# Patient Record
Sex: Male | Born: 1950 | Race: White | Hispanic: No | Marital: Married | Smoking: Former smoker
Health system: Southern US, Community
[De-identification: ages and names within clinical notes are randomized; demographics above are authoritative.]

## PROBLEM LIST (undated history)

## (undated) DIAGNOSIS — F1220 Cannabis dependence, uncomplicated: Secondary | ICD-10-CM

## (undated) DIAGNOSIS — F32A Depression, unspecified: Secondary | ICD-10-CM

## (undated) DIAGNOSIS — I10 Essential (primary) hypertension: Secondary | ICD-10-CM

## (undated) DIAGNOSIS — F329 Major depressive disorder, single episode, unspecified: Secondary | ICD-10-CM

## (undated) HISTORY — DX: Major depressive disorder, single episode, unspecified: F32.9

## (undated) HISTORY — PX: NO PAST SURGERIES: SHX2092

## (undated) HISTORY — DX: Depression, unspecified: F32.A

## (undated) HISTORY — DX: Cannabis dependence, uncomplicated: F12.20

## (undated) HISTORY — DX: Essential (primary) hypertension: I10

---

## 2014-02-10 ENCOUNTER — Ambulatory Visit (INDEPENDENT_AMBULATORY_CARE_PROVIDER_SITE_OTHER): Payer: BC Managed Care – PPO | Admitting: Psychiatry

## 2014-02-10 ENCOUNTER — Encounter (INDEPENDENT_AMBULATORY_CARE_PROVIDER_SITE_OTHER): Payer: Self-pay

## 2014-02-10 VITALS — BP 176/82 | HR 70 | Ht 67.5 in | Wt 174.8 lb

## 2014-02-10 DIAGNOSIS — F1210 Cannabis abuse, uncomplicated: Secondary | ICD-10-CM

## 2014-02-10 DIAGNOSIS — F332 Major depressive disorder, recurrent severe without psychotic features: Secondary | ICD-10-CM

## 2014-02-10 DIAGNOSIS — F339 Major depressive disorder, recurrent, unspecified: Secondary | ICD-10-CM

## 2014-02-10 MED ORDER — VENLAFAXINE HCL ER 75 MG PO CP24: 225.0000 mg | ORAL_CAPSULE | Freq: Every day | ORAL | Status: DC

## 2014-02-10 NOTE — Progress Notes (Signed)
Psychiatric Assessment Adult  Patient Identification:  Jose Perez Date of Evaluation:  02/10/2014 Chief Complaint: History of Depression, Anxiety, Cannabis Abuse  History of Chief Complaint:  No chief complaint on file.   HPI Patient is a 63 year old male, who is new to [facility], but known to me from prior treatment . 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 He has a history of Major Depression, Social Anxiety, and Cannabis Dependence in the past. For the latter, he was in contract with [facility] for a period of several years, which included random drug testing and which he completed successfully. 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  Patient has been grieving her death, and of concern he also relapsed on cannabis, which he has smoked on several occasions  Since sister's death- last smoked 10-15 days ago. He relapsed after close to 9 years of sobriety, while visiting dying sister in [place] with his brother, who " offered me some pot,I just took it". Of note, he denies any cannabis use during  Work and denies any working under the influence.  Patient denies any serious depression or any severe neuro-vegetative symptoms of depression at this time. He does feel he is sad, due to death of sister. He acknowledges he has been having some marital discord and tension related to his cannabis use- and  Wife ( who has a history of alcohol dependence but has been sober for many years) has " given me a hard time about it" He also recently started smoking cigarettes lately ( 1 PPD now) after several years of not smoking. He takes Effexor XR for depression, he has been on this medication for 10 years and it has been very effective and well tolerated .  Review of  Systems No headache, no visual disturbances, no chest pain, no shortness of breath, no abdominal pain, no melenas, no GU symptoms, no peripheral edema, no neurologic symptoms, no rash Physical Exam  Depressive Symptoms: depressed mood, loss of energy/fatigue, denies anhedonia, denies any SI  (Hypo) Manic Symptoms:   Elevated Mood:  No Irritable Mood:  No Grandiosity:  No Distractibility:  No Labiality of Mood:  No Delusions:  No Hallucinations:  No Impulsivity:  No Sexually Inappropriate Behavior:  No Financial Extravagance:  No Flight of Ideas:  No  Anxiety Symptoms: Excessive Worry:  No Panic Symptoms:  No Agoraphobia:  No Obsessive Compulsive: No  Symptoms: no Specific Phobias:  No Social Anxiety:  Yes  Psychotic Symptoms:  Hallucinations: No None Delusions:  No Paranoia:  No   Ideas of Reference:  No  PTSD Symptoms: Ever had a traumatic exposure:  No Had a traumatic exposure in the last month:  No Re-experiencing: No None Hypervigilance:  No Hyperarousal: No None Avoidance: No None  Traumatic Brain Injury: No none   Past Psychiatric History: Diagnosis: He has been diagnosed with MDD, Social Anxiety, and Cannabis Dependence.  Hospitalizations: One prior psychiatric admission 9-10 years ago, due to severe  depression.  A that time received ECT   Outpatient Care: establishing care here at [facility]   Substance Abuse Care: Had been going to [facility] in the past   Self-Mutilation: No   Suicidal Attempts:No   Violent Behaviors:  No    Past Medical History:  HTN  History of Loss of Consciousness:  No Seizure History:  No Cardiac History:  No Allergies: NKDA Current Medications: Effexor XR 225 mgrs QDAY. He is also on an antihypertensive medication ( lisinopril?) No current outpatient prescriptions on file.   No current facility-administered medications for this visit.    Previous Psychotropic Medications:  Medication Dose    Effexor XR   225 mgrs a day- has been on this medication x 9 years with good response                      Substance Abuse History in the last 12 months: Substance Age of 1st Use Last Use Amount Specific Type  Nicotine  young adult      Alcohol  social drinker in the past      Cannabis  college age   one week ago     Opiates  no history      Cocaine  no history     Methamphetamines  no history     LSD  no history     Ecstasy  no history      Benzodiazepines  no history     Caffeine       Inhalants  no history      Others:                          Medical Consequences of Substance Abuse: none   Legal Consequences of Substance Abuse: none   Family Consequences of Substance Abuse: none   Blackouts:  No DT's:  No Withdrawal Symptoms:  No None  Social History: Current [place], [place] Place of Birth: [place] Family Members:  Sister recently passed away from Esophageal Cancer- this seems to have been a major trigger for decompensation Marital Status:  Married Children: 3 adult children, two in college   Sons:   Daughters:  Relationships:  Education: Baristahysician  Educational Problems/Performance:  Religious Beliefs/Practices: Catholic  History of Abuse: No history of abuse  Teacher, musicccupational Experiences; Military History:  None. Legal History:Denies  Hobbies/Interests:   Family History: Father  ,  One Sister had history of Schizoaffective Disorder , Anxiety Mental Status Examination/Evaluation: Objective:  Appearance: Well Groomed  Eye Contact::  Good  Speech:  Normal Rate  Volume:  Normal  Mood:  Mildly depressed but full range of affect   Affect:  constricted and full in range  Thought Process:  Goal Directed and Linear  Orientation:  Full (Time, Place, and Person)  Thought Content:  no hallucinations,no delusions  Suicidal Thoughts:  No- denies any suicidal ideations or any thoughts of hurting himself in any way or anyone else   Homicidal  Thoughts:  No  Judgement:  Good  Insight:  Good  Psychomotor Activity:  Normal  Akathisia:  No  Handed:  Right  AIMS (if indicated):    Assets:  Communication Skills Desire for Improvement Financial Resources/Insurance Housing Resilience Social Support Talents/Skills    Laboratory/X-Ray Psychological Evaluation(s)        Assessment:   Patient is a 63 year old man known to me from prior outpatient treatment. [facility] to establish Outpatient medication management. He is married, a practicing physician He has a history of Major Depression, with a severe episode 10 years ago, a history of Cannabis Abuse, and had been  Sober x 9 years up to recently.  He also has a history of Social Anxiety. Effexor XR, on which he has been for many years, has been a very effective and well tolerated medication for him. He had been doing well but after his sister's death a few months ago from cancer, he has been having some grief , but no  Symptoms of MDD. He did , of concern, relapse on cannabis while visiting his dying sister, and has consumed cannabis a few times since then, last time About 10-15 days ago. The licensure constraints Carolyn Stare/fears that helped him consolidate sobriety years ago are less effective now, as he states he is in the process  Of retiring from active practice anyway, and no longer has any financial concerns. He does acknowledge his cannabis relapse has caused some marital discord. Of note BP was elevated upon admission, retaken as he left , had improved to 150/87  AXIS I Major Depression, Recurrent, no psychotic features. Cannabis Abuse   AXIS II Deferred  AXIS III No past medical history on file.   AXIS IV death of sister 4 months ago, marital tension  AXIS V 60-65   Treatment Plan/Recommendations:  Plan of Care: Continue outpatient treatment, for medication management. Have encouraged patient to stop smoking, stop using cannabis and start going to AA or  NA again   Laboratory:  none at this time   Psychotherapy:patient may benefit from individual psychotherapy and I have recommended he consider this option  Medications:  Continue Effexor XR 225 mgrs QDAY. Have renewed it x 1 month  Routine PRN Medications:  No  Consultations: No   Safety Concerns: No concerns about imminent risk at this time based on this visit. Patient to go to ED if any SI emerges .  Other:  Have encouraged patient to go to see his PCP soon to see if he needs adjustment of his antihypertensive management. Patient is aware that Effexor XR could contribute to elevated BP, but as he has done so well with this medication we agreed to continue .     Nehemiah MassedOBOS, [name], MD 11/3/20151:16 PM

## 2014-02-24 ENCOUNTER — Encounter (HOSPITAL_COMMUNITY): Payer: Self-pay | Admitting: Psychiatry

## 2014-03-10 ENCOUNTER — Ambulatory Visit (INDEPENDENT_AMBULATORY_CARE_PROVIDER_SITE_OTHER): Payer: Medicare Other | Admitting: Psychiatry

## 2014-03-10 VITALS — BP 162/90 | HR 85 | Ht 67.0 in | Wt 174.6 lb

## 2014-03-10 DIAGNOSIS — F1220 Cannabis dependence, uncomplicated: Secondary | ICD-10-CM

## 2014-03-10 DIAGNOSIS — F4010 Social phobia, unspecified: Secondary | ICD-10-CM

## 2014-03-10 DIAGNOSIS — F321 Major depressive disorder, single episode, moderate: Secondary | ICD-10-CM

## 2014-03-10 DIAGNOSIS — F329 Major depressive disorder, single episode, unspecified: Secondary | ICD-10-CM

## 2014-03-10 NOTE — Progress Notes (Signed)
[facility] [id number] Progress Note  Lynnette Caffeyhomas A Sedlacek [id number] 63 y.o.  03/10/2014 1:19 PM  Chief Complaint: Patient comes in with his wife requesting family meeting- see below  History of Present Illness:I saw patient today for a period of 50 minutes,  along with his wife, for a  FAMILY MEETING.   As noted, patient is a 63 year old man, with a history of MDD , Cannabis Dependence, and Social Anxiety.  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  He had been sober for close to 10 years, but relapsed earlier this year after his sister died from esophageal cancer and his brother offered him cannabis while they were visiting her prior to her death.  Since then he admits he has used on several occasions, last time on day prior to Thanksgiving.   His wife- they have been married 30 years -, who is known to me from family meetings in the past, has a history of alcohol dependence, and has been sober x 9 years. She is very active in [place] and has a sponsor.  Regarding patient's psychiatric management he has been stable on Effexor XR 225 mgrs QDAY , which he tolerates well and which he has been on for years.  At this time he denies any significant depression. Sleep, appetite, energy level , reported as normal. No anhedonia, no SI.   Wife does state, however, that since he relapsed he has been more prone to neglecting chores around the house, more prone to procrastinate. He denies.  MSE- at this time patient is fully alert, attentive, oriented x 3, no evidence of intoxication, mood is "OK", denies depression, affect appropriate, subtly irritable at times, no thought disorder, no SI , no HI, no hallucinations, no delusions, future oriented. Looking forward to retirement in 4-5 months, and thinking of where to relocate.  They wanted to discuss how his relapse has negatively affected family dynamics. Patient 's wife angry that he relapsed, and concerned it will  herald another depressive episode in the future. She states he should become active in 12 steps , get a sponsor , and work on his recovery.  He admits he is ambivalent about stopping cannabis completely at this time. He states he is smoking only occasionally. He feels cannabis improves his mentation and he feels more creative when on cannabis. He feels more at ease as well. She points out that the opposite is true- he tends to be more passive and yet more irritable when using.  We discussed the above issues at length, I validated how substance abuse can negatively impact trust and contribute to marital tension. Patient does state he plans to maintain sobriety for now, and states he has no cannabis left, but admits he is thinking of using next time he travels to see his brother for Christmas.  ASSESSMENT- patient not depressed, but continuing to smoke cannabis , although not daily. States he last smoked day prior to thanksgiving. His relapse has caused marital tension. 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 venlafaxine XR (EFFEXOR XR) 75 MG 24 hr capsule Take 3 capsules (225 mg total) by mouth daily with breakfast.    Past Psychiatric History/Hospitalization(s): Anxiety: Yes Bipolar Disorder: No Depression: Yes Mania: No Psychosis: No Schizophrenia:  No Personality Disorder: No Hospitalization for psychiatric illness: Yes- 10 years ago- for depression.  History of Electroconvulsive Shock Therapy: Yes- 10 years ago for above  Prior Suicide Attempts: No  Physical Exam: Constitutional:  There were no vitals taken for this visit.  General Appearance: alert, oriented, no acute distress  Musculoskeletal: Strength & Muscle Tone: within normal limits Gait & Station: normal Patient leans: N/A  Psychiatric: Speech (describe rate, volume, coherence, spontaneity, and abnormalities if any): normal  Thought Process (describe rate, content, abstract reasoning, and computation): normal   Associations: Intact  Thoughts: normal  Mental Status: Orientation: 0x3  Mood & Affect: normal affect- subtly irritable Attention Span & Concentration: good   Medical Decision Making (Choose Three): Established Problem, Stable/Improving (1), Review of Psycho-Social Stressors (1) and Review of Last Therapy Session (1)  Assessment: Axis I: MDD, Social Phobia, Cannabis Dependence   Axis II: deferred   Axis III: HTN  Axis IV: marital strain- see above. Recent death of sister   Axis V:  2970  PLAN-  I agreed to see him for medication management and support in two to three weeks, prior to Christmas. Will refer for couples counseling if patient and wife interested. Encouraged patient to abstain and engage in 12 step participation. Continue Effexor XR 225 mgrs Daily- does not need script. Continue to follow up with his PCP for BP management. Patient agrees to contact me sooner if any worsening prior to next appt   Nehemiah MassedOBOS, Yasheka Fossett, MD 03/10/2014

## 2014-03-10 NOTE — Progress Notes (Signed)
I saw patient today for a period of 50 minutes,  along with his wife, for a  FAMILY MEETING.   As noted, patient is a [age] year old man, with a history of MDD , Cannabis Dependence, and Social Anxiety.  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  He had been sober for close to 10 years, but relapsed earlier this year after his sister died from esophageal cancer and his brother offered him cannabis while they were visiting her prior to her death.  Since then he admits he has used on several occasions, last time on day prior to Thanksgiving.   His wife- they have been married 30 years -, who is known to me from family meetings in the past, has a history of alcohol dependence, and has been sober x 9 years. She is very active in [employer] and has a sponsor.  Regarding patient's psychiatric management he has been stable on Effexor XR 225 mgrs QDAY , which he tolerates well and which he has been on for years.  At this time he denies any significant depression. Sleep, appetite, energy level , reported as normal. No anhedonia, no SI.   Wife does state, however, that since he relapsed he has been more prone to neglecting chores around the house, more prone to procrastinate. He denies.  MSE- at this time patient is fully alert, attentive, oriented x 3, no evidence of intoxication, mood is "OK", denies depression, affect appropriate, subtly irritable at times, no thought disorder, no SI , no HI, no hallucinations, no delusions, future oriented. Looking forward to retirement in 4-5 months, and thinking of where to relocate.  They wanted to discuss how his relapse has negatively affected family dynamics. Patient 's wife angry that he relapsed, and concerned it will herald another depressive episode in the future. She states he should become active in 12 steps , get a sponsor , and work on his recovery.  He admits he is ambivalent about stopping cannabis completely at this time. He states  he is smoking only occasionally. He feels cannabis improves his mentation and he feels more creative when on cannabis. He feels more at ease as well. She points out that the opposite is true- he tends to be more passive and yet more irritable when using.  We discussed the above issues at length, I validated how substance abuse can negatively impact trust and contribute to marital tension. Patient does state he plans to maintain sobriety for now, and states he has no cannabis left, but admits he is thinking of using next time he travels to see his brother for Christmas.  ASSESSMENT- patient not depressed, but continuing to smoke cannabis , although not daily. States he last smoked day prior to thanksgiving. His relapse has caused marital tension. Tolerating Effexor XR well  PLAN-  I agreed to see him for medication management and support in two to three weeks, prior to Christmas. Will refer for couples counseling if patient and wife interested. Encouraged patient to abstain and engage in 12 step participation. Continue Effexor XR 225 mgrs Daily- does not need script. Continue to follow up with his PCP for BP management. Patient agrees to contact me sooner if any worsening prior to next appt

## 2014-03-13 ENCOUNTER — Encounter (HOSPITAL_COMMUNITY): Payer: Self-pay | Admitting: Psychiatry

## 2014-03-13 DIAGNOSIS — F1220 Cannabis dependence, uncomplicated: Secondary | ICD-10-CM

## 2014-03-13 DIAGNOSIS — F329 Major depressive disorder, single episode, unspecified: Secondary | ICD-10-CM | POA: Insufficient documentation

## 2014-03-13 HISTORY — DX: Cannabis dependence, uncomplicated: F12.20

## 2014-03-24 ENCOUNTER — Ambulatory Visit (HOSPITAL_COMMUNITY): Payer: BC Managed Care – PPO | Admitting: Psychiatry

## 2014-04-14 ENCOUNTER — Encounter (HOSPITAL_COMMUNITY): Payer: Self-pay | Admitting: Psychiatry

## 2014-04-14 ENCOUNTER — Ambulatory Visit (INDEPENDENT_AMBULATORY_CARE_PROVIDER_SITE_OTHER): Payer: Medicare Other | Admitting: Psychiatry

## 2014-04-14 VITALS — BP 144/78 | HR 74 | Ht 68.0 in | Wt 172.2 lb

## 2014-04-14 DIAGNOSIS — F1220 Cannabis dependence, uncomplicated: Secondary | ICD-10-CM

## 2014-04-14 DIAGNOSIS — F332 Major depressive disorder, recurrent severe without psychotic features: Secondary | ICD-10-CM

## 2014-04-14 DIAGNOSIS — F4010 Social phobia, unspecified: Secondary | ICD-10-CM

## 2014-04-14 DIAGNOSIS — F329 Major depressive disorder, single episode, unspecified: Secondary | ICD-10-CM

## 2014-04-14 MED ORDER — BUSPIRONE HCL 10 MG PO TABS: 10.0000 mg | ORAL_TABLET | Freq: Two times a day (BID) | ORAL | Status: DC

## 2014-04-14 NOTE — Progress Notes (Signed)
[facility] [id number] Progress Note  Jose Caffeyhomas A Perez [id number] 64 y.o.  04/14/2014 2:43 PM  Chief Complaint: Depression, history of Cannabis Dependence   Subjective : Patient states he is improved compared to last visit. He states he has not used cannabis  Regularly since last Thanksgiving, except for one isolated episode in December. He has been experiencing some increased free floating anxiety, but states that overall he has been able to function well in his everyday activities, to include work.  Objective : At this time patient is improved- he presents with an improved mood, and fuller range of affect. His wife came with him and she corroborates that he " is more like his usual self" and that their relationship has dramatically improved over recent weeks, which she attributes to his abstinence from cannabis. He acknowledges improvement and that " the pot changes me" . As noted, he continues to function well in daily activities, to include working as a Development worker, communityphysician. He does state he has been experiencing some increased anxiety compared to prior, and describes both some social phobia and a sense of free floating ill defined anxiety. 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 States " there were so many people in the house, it made me anxious". He is not feeling particularly depressed and states that Effexor XR, which he has been taking for many years, continues to be well tolerated and effective. Of note, patient had restarted smoking cigarettes when he relapsed on cannabis and after his sister died in May 2015. He has now stopped since 12/30. Had an isolated episode of dizziness/pre-syncope 3 weeks ago, unsure of etiology, but thinks it may have been related to dehydration and not eating well that day. Is following up with his  PCP regarding this.    History of Present Illness: Suicidal Ideation: No Plan Formed: No Patient has means to carry out plan: No  Homicidal Ideation: No Plan Formed: No Patient has means to carry out plan: No  Review of Systems: Psychiatric: Agitation: No Hallucination: No Depressed Mood: No Insomnia: No Hypersomnia: No Altered Concentration: No Feels Worthless: No Grandiose Ideas: No Belief In Special Powers: No New/Increased Substance Abuse: as noted, now sober x 3-4 weeks Compulsions: No  Neurologic: Headache: No Seizure: No Paresthesias: No  Past Medical Family, Social History:  No changes, has history of HTN- well controlled on antihypertensive medication.   Outpatient Encounter Prescriptions as of 04/14/2014  Medication Sig  . busPIRone (BUSPAR) 10 MG tablet Take 1 tablet (10 mg total) by mouth 2 (two) times daily.  Marland Kitchen. venlafaxine XR (EFFEXOR XR) 75 MG 24 hr capsule Take 3 capsules (225 mg total) by mouth daily with breakfast.    Past Psychiatric History/Hospitalization(s): Anxiety: Yes Bipolar Disorder: No Depression: Yes Mania: No Psychosis: No Schizophrenia: No  Personality Disorder: No Hospitalization for psychiatric illness: Yes- 10 years ago- for depression- at that time was treated with ECT . History of Electroconvulsive Shock Therapy: Yes- as above.  Prior Suicide Attempts: No  Physical Exam: Constitutional:  BP 144/78 mmHg  Pulse 74  Ht 5\' 8"  (1.727 m)  Wt 172 lb 3.2 oz (78.109 kg)  BMI 26.19 kg/m2  General Appearance: alert, oriented, no acute distress and well nourished  Musculoskeletal: Strength & Muscle Tone: within normal limits Gait & Station: normal Patient leans: N/A  Psychiatric: Speech (describe  rate, volume, coherence, spontaneity, and abnormalities if any): Normal  Thought Process (describe rate, content, abstract reasoning, and computation): Linear , well organized   Associations: Intact  Thoughts: normal and no suicidal ,  no homicidal , no psychotic symptoms  Mental Status: Orientation: 0x3  Mood & Affect: normal affect Attention Span & Concentration: normal  Medical Decision Making (Choose Three): Established Problem, Stable/Improving (1), Review of Psycho-Social Stressors (1) and Review of New Medication or Change in Dosage (2)  Assessment: Axis I: MDD by History, Social Phobia, Cannabis Dependence  Axis II: none   Axis III: HTN by history  Axis IV:  Some marital strain- now improving, death of sister from esophageal cancer in May 2015 ( they were very close), planning on retiring later this year  Axis V: 70   Plan:  Continue Effexor XR 225 mgrs QDAY Start Buspar 10 mgrs BID for anxiety- side effects reviewed Encourage/promote ongoing  abstinence from  Cannabis and nicotine Will see in 4-5 weeks Agrees to contact me sooner if any worsening prior  Nehemiah Massed, MD 04/14/2014

## 2014-05-19 ENCOUNTER — Encounter (HOSPITAL_COMMUNITY): Payer: Self-pay | Admitting: Psychiatry

## 2014-05-19 ENCOUNTER — Ambulatory Visit (INDEPENDENT_AMBULATORY_CARE_PROVIDER_SITE_OTHER): Payer: Medicare Other | Admitting: Psychiatry

## 2014-05-19 VITALS — BP 126/72 | HR 72 | Ht 67.0 in | Wt 176.6 lb

## 2014-05-19 DIAGNOSIS — F329 Major depressive disorder, single episode, unspecified: Secondary | ICD-10-CM

## 2014-05-19 DIAGNOSIS — F4010 Social phobia, unspecified: Secondary | ICD-10-CM

## 2014-05-19 DIAGNOSIS — F332 Major depressive disorder, recurrent severe without psychotic features: Secondary | ICD-10-CM

## 2014-05-19 DIAGNOSIS — I10 Essential (primary) hypertension: Secondary | ICD-10-CM

## 2014-05-19 DIAGNOSIS — F1221 Cannabis dependence, in remission: Secondary | ICD-10-CM

## 2014-05-19 HISTORY — DX: Essential (primary) hypertension: I10

## 2014-05-19 MED ORDER — VENLAFAXINE HCL ER 75 MG PO CP24: 225.0000 mg | ORAL_CAPSULE | Freq: Every day | ORAL | Status: DC

## 2014-05-19 MED ORDER — BUSPIRONE HCL 10 MG PO TABS: 10.0000 mg | ORAL_TABLET | Freq: Two times a day (BID) | ORAL | Status: DC

## 2014-05-19 NOTE — Progress Notes (Signed)
[facility] [id number] Progress Note  Jose Perez [id number] 64 y.o.  05/19/2014 1:49 PM  Time spent with patient- 35 minutes   Subjective-  At this time patient is doing well. He states he is not feeling depressed, and is not endorsing any neuro-vegetative symptoms of depression. He states he is functioning well in daily activities, to include working full time. Relationship with wife, which had become strained after his relapse, has normalized. He is now sober x almost three months. He denies any cravings. He denies any medication side effects. He states the Buspar, which as added recently, has been well tolerated and has helped decrease symptoms of anxiety.  Objective -  Patient is currently doing well. As noted above, he is now sober and is doing well in his daily activities.  In the context of sobriety he states " I feel more like myself" and states that " the personality changes I  had when I was using have gone away" ( with drug use tended to be somewhat more irritable). He is currently tolerating medications well and denies side effects. Sleep, appetite, energy level all within normal limits. No Anhedonia. Responds well to support, encouragement , review of progress. I have encouraged him to go to AA or NA , but he admits he is not interested in this treatment modality at this time.    History of Present Illness: Suicidal Ideation: No Plan Formed: No Patient has means to carry out plan: No  Homicidal Ideation: No Plan Formed: No Patient has means to carry out plan: No  Review of Systems: Psychiatric: Agitation: No Hallucination: No Depressed Mood: No Insomnia: No Hypersomnia: No Altered Concentration: No Feels Worthless: No Grandiose Ideas: No Belief In Special Powers: No New/Increased Substance Abuse: No Compulsions: No  Neurologic: Headache: No Seizure: No Paresthesias: No  Past Medical Family, Social History:  At this time functioning well ,  reports improved relationship with wife, has spent   More time maintaining communication  with adult children. 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   Outpatient Encounter Prescriptions as of 05/19/2014  Medication Sig  . benazepril (LOTENSIN) 10 MG tablet Take 10 mg by mouth daily.  . busPIRone (BUSPAR) 10 MG tablet Take 1 tablet (10 mg total) by mouth 2 (two) times daily.  Marland Kitchen. venlafaxine XR (EFFEXOR XR) 75 MG 24 hr capsule Take 3 capsules (225 mg total) by mouth daily with breakfast.    Past Psychiatric History/Hospitalization(s): Anxiety: Yes Bipolar Disorder: No Depression: Yes Mania: No Psychosis: No Schizophrenia: No Personality Disorder: No Hospitalization for psychiatric illness: Yes History of Electroconvulsive Shock Therapy: Yes Prior Suicide Attempts: No  Physical Exam: Constitutional:  BP 126/72 mmHg  Pulse 72  Ht 5\' 7"  (1.702 m)  Wt 176 lb 9.6 oz (80.105 kg)  BMI 27.65 kg/m2  General Appearance: alert, oriented, no acute distress and well nourished  Musculoskeletal: Strength & Muscle Tone: within normal limits Gait & Station: normal Patient leans: N/A  Psychiatric: Speech (describe rate, volume, coherence, spontaneity, and abnormalities if any): normal    Thought Process (describe rate, content, abstract reasoning, and computation):linear , well organized   Associations: Coherent and Intact  Thoughts: normal and no hallucinations, no delusions, no suicidal ideations , no homicidal ideations  Mental Status: Orientation: oriented x 3  Mood & Affect: normal affect and mood euthymic at this time Attention Span & Concentration: within normal limits   Medical Decision Making (Choose Three): Established Problem, Stable/Improving (1), Review of Last  Therapy Session (1) and Review of Medication Regimen & Side Effects (2)  Assessment: Currently patient is improved and at this time euthymic. He is sober x almost three months, and in the context of  sobriety is functioning better in everyday activities and relationships. He is tolerating medications well.   Diagnosis Major Depression , without psychotic symptoms Cannabis Dependence in early remission Social Phobia by history   Plan: Return in 5 -6 weeks. Agrees to contact me sooner if any worsening prior Renew Effexor XR 225 mgrs QAM Renew Buspar 10 mgrs BID ( x 1 month with one refill)  Side effects have been reviewed.   Nehemiah Massed, MD 05/19/2014

## 2014-06-23 ENCOUNTER — Encounter (HOSPITAL_COMMUNITY): Payer: Self-pay | Admitting: Psychiatry

## 2014-06-23 ENCOUNTER — Ambulatory Visit (INDEPENDENT_AMBULATORY_CARE_PROVIDER_SITE_OTHER): Payer: Medicare Other | Admitting: Psychiatry

## 2014-06-23 VITALS — BP 162/98 | HR 90 | Ht 67.0 in | Wt 175.0 lb

## 2014-06-23 DIAGNOSIS — F329 Major depressive disorder, single episode, unspecified: Secondary | ICD-10-CM

## 2014-06-23 DIAGNOSIS — F1221 Cannabis dependence, in remission: Secondary | ICD-10-CM

## 2014-06-23 DIAGNOSIS — F4010 Social phobia, unspecified: Secondary | ICD-10-CM

## 2014-06-23 DIAGNOSIS — F332 Major depressive disorder, recurrent severe without psychotic features: Secondary | ICD-10-CM

## 2014-06-23 MED ORDER — VENLAFAXINE HCL ER 75 MG PO CP24: 225.0000 mg | ORAL_CAPSULE | Freq: Every day | ORAL | Status: DC

## 2014-06-23 NOTE — Progress Notes (Signed)
[facility] [id number] Progress Note  Jose Caffeyhomas A Perez [id number] 64 y.o.  06/23/2014 2:13 PM  Chief Complaint:  Patient states that he is doing better .  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 He states that he has not used any cannabis since November of 2015. At this time he is tolerating medications well and denies side effects.  Objective : Presents with improvement and is currently stable. Denies any significant depression or neuro-vegetative symptom of MDD . Social Anxiety symptoms , which had exacerbated after last year's stressors, has tapered/ abated. As noted, at this time he reports sobriety since 11/15, and denies current cravings for cannabis, which was his substance of choice. States he is doing well at work and reports stable family relationships / functioning. He is somewhat hypertensive today, but states that he often checks his BP at home and it is " usually in the 120/80 range ". He suspectes that he has some degree of " White lab coat syndrome" so that BP is artificially elevated on these visits. He states he has an upcoming trip to [place] to visit his elderly mother. This could be an issue, since brother who offered him cannabis and facilitated relapse lives with mother. Patient is aware of this concern, and states he has arranged trip so that he will be with his wife and adult daughter at all times. He denies cravings and states he feels strong in his recovery at this time. He is tolerating Effexor XR and Buspar ( new medication for him ) well. He responds well to support, empathy, and review of his coping skills .   History of Present Illness: Suicidal Ideation: No Plan Formed: No Patient has means to carry out plan: No  Homicidal Ideation: No Plan Formed: No Patient has means to carry out plan:  No  Review of Systems: Psychiatric: Agitation: No Hallucination: No Depressed Mood: No Insomnia: No Hypersomnia: No Altered Concentration: No Feels Worthless: No Grandiose Ideas: No Belief In Special Powers: No New/Increased Substance Abuse: No Compulsions: No  Neurologic: Headache: No Seizure: No Paresthesias: No  Past Medical Family, Social History: married x many years, three adult children, all successful, youngest in Social workerlaw school, middle child graduated [employer] and is now in her first job, older son in [place] working full time,  Patient  Works full time as MD.   Outpatient Encounter Prescriptions as of 06/23/2014  Medication Sig  . benazepril (LOTENSIN) 10 MG tablet Take 10 mg by mouth daily.  . busPIRone (BUSPAR) 10 MG tablet Take 1 tablet (10 mg total) by mouth 2 (two) times daily.  Marland Kitchen. venlafaxine XR (EFFEXOR XR) 75 MG 24 hr capsule Take 3 capsules (225 mg total) by mouth daily with breakfast.  . [DISCONTINUED] venlafaxine XR (EFFEXOR XR) 75 MG 24 hr capsule Take 3 capsules (225 mg total) by mouth daily with breakfast.    Past Psychiatric History/Hospitalization(s): 10 years ago for severe depression at the time Anxiety: Yes- has history of social anxiety, much improved with treatment  Bipolar Disorder: No Depression: Yes as above ( currently not depressed , however)  Mania: No Psychosis: No Schizophrenia: No Personality Disorder: No Hospitalization for psychiatric illness: Yes- as above, 10 years ago, at which time he did get ECT  History of Electroconvulsive Shock Therapy: Yes Prior Suicide Attempts: No  Physical Exam: Constitutional:  BP 162/98 mmHg  Pulse 90  Ht  (1.702 m)  Wt 175 lb (79.379 kg)  BMI 27.40 kg/m2  General Appearance: alert, oriented, no acute distress and well nourished  Musculoskeletal: Strength & Muscle Tone: within normal limits Gait & Station: normal Patient leans: N/A  Psychiatric: Speech (describe rate, volume,  coherence, spontaneity, and abnormalities if any): normal tone, volume and rate   Thought Process (describe rate, content, abstract reasoning, and computation):  Linear and well organized   Associations: Coherent and Relevant  Thoughts: normal and no SI, no HI, no hallucinations,  no dellusions  Mental Status: Orientation: fully alert, attentive, oriented  Mood & Affect: normal affect and mood is euthymic at the present time Attention Span & Concentration:  Within normal   Medical Decision Making (Choose Three): Established Problem, Stable/Improving (1), Review of Psycho-Social Stressors (1) and Review of Medication Regimen & Side Effects (2)  Assessment:  Briefly, 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 He had a very severe episode of depression 10 years ago, which required inpatient admission and ECT. At that time he was also using cannabis regularly. After that  ( and with involvement in [facility] for the first several years) he remained stable and euthymic for 9+ years. Last year, following the deaths of his elderly father and of his sister, to whom he was very close , he decompensated/relapsed on cannabis , and developed worsening anxiety symptoms. He stopped using cannabis as of November 2015, and since then has tended to improved. At this time he is asymptomatic and functioning att baseline. Tolerating medications well .  Axis I:  MDD, Social Phobia, Cannabis Dependence, currently in early remission  Axis II: none   Axis III: none   Axis IV:  Death of sister and father   Axis V: 75   Plan:  Continue Effexor XR 225 mgrs a day Continue Buspar 10 mgrs BID Will see in four weeks.  Patient agrees to contact me sooner if any worsening prior.   Nehemiah Massed, MD 06/23/2014

## 2014-07-09 ENCOUNTER — Telehealth (HOSPITAL_COMMUNITY): Payer: Self-pay | Admitting: Psychiatry

## 2014-07-09 NOTE — Telephone Encounter (Signed)
Patient called me recently , stating that [place] was in [place] visiting family, and requesting call back . I have made several attempts to call back at his cell pone and today at his home phone. No answer, so have left message asking him to call me back.

## 2014-07-21 ENCOUNTER — Ambulatory Visit (HOSPITAL_COMMUNITY): Payer: Medicare Other | Admitting: Psychiatry

## 2014-08-04 ENCOUNTER — Encounter (HOSPITAL_COMMUNITY): Payer: Self-pay | Admitting: Psychiatry

## 2014-08-04 ENCOUNTER — Ambulatory Visit (INDEPENDENT_AMBULATORY_CARE_PROVIDER_SITE_OTHER): Payer: Medicare Other | Admitting: Psychiatry

## 2014-08-04 VITALS — BP 156/88 | HR 84 | Ht 67.0 in | Wt 174.2 lb

## 2014-08-04 DIAGNOSIS — F1220 Cannabis dependence, uncomplicated: Secondary | ICD-10-CM

## 2014-08-04 DIAGNOSIS — F1210 Cannabis abuse, uncomplicated: Secondary | ICD-10-CM

## 2014-08-04 DIAGNOSIS — F4010 Social phobia, unspecified: Secondary | ICD-10-CM

## 2014-08-04 DIAGNOSIS — F3342 Major depressive disorder, recurrent, in full remission: Secondary | ICD-10-CM

## 2014-08-04 NOTE — Progress Notes (Signed)
[facility] Medication Management Note - Family session  Duration 55 minutes   Jose Perez [id number] 64 y.o.  08/04/2014 6:37 PM  Subjective -  Jose Perez  Reports recent relapse on cannabis , relating to recent trip to [place] to visit his elderly mother. His brother resides with mother and smokes cannabis, and Jose Perez relapsed soon after his arrival there. He also smoked cannabis recently while on a trip to [place] recently. States last used cannabis last week.  Objective : As noted, Jose Perez had recent cannabis relapse. Of note, he requested to have a conversation with wife via phone as well , and we had a teleconference interaction with Jose Perez and her . 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 Wife states that this is a dangerous trip and that he should have thought it out more carefully and considered her input. Of note, Jose Perez's wife is alcoholic , now sober x 10 + years and very involved in 12 step meetings. She states she has encouraged Jose Perez to attend NA or AA but has not been interested.  His drug use has caused increased marital tension recently. Jose Perez acknowledges he changes with cannabis, but points out some of the changes are positive - less introverted, more likely to do challenging things, less anxious.  We discussed how his cannabis use has also been related to his sister's death and more recently his mother being diagnosed with cancer. Jose Perez acknowledges that cannabis likely keeps him from feeling the grief more acutely. At this time denies  depression, and is not reporting any neuro-vegetative symptoms of depression.  Also, is not presenting or reporting symptoms of mania, and recent behaviors as above do not seem correlated with any mania.  He remains on Buspar and Effexor XR and is tolerating these medications well- denies side effects.   History of Present Illness: Suicidal Ideation: No Plan Formed: No Jose Perez has means to carry out plan: No  Homicidal Ideation: No Plan Formed: No Jose Perez has means to carry out plan: No  Review of Systems: Psychiatric: Agitation: No Hallucination: No Depressed Mood: No Insomnia: No Hypersomnia: No Altered Concentration: No Feels Worthless: No Grandiose Ideas: No Belief In Special Powers: No New/Increased Substance Abuse: Yes Compulsions: No  Neurologic: Headache: No Seizure: No Paresthesias: No   Outpatient Encounter Prescriptions as of 08/04/2014  Medication Sig  . benazepril (LOTENSIN) 10 MG tablet Take 10 mg by mouth daily.  . busPIRone (BUSPAR) 10 MG tablet Take 1 tablet (10 mg total) by mouth 2 (two) times daily.  Marland Kitchen. venlafaxine XR (EFFEXOR XR) 75 MG 24 hr capsule Take 3 capsules (225 mg total) by mouth daily with breakfast.    Past Psychiatric History/Hospitalization(s): Anxiety: Yes- Jose Perez states his social anxiety tends to improve with cannabis use  Bipolar Disorder: No Depression: Yes Mania: No Psychosis: No Schizophrenia: No Personality Disorder: No Hospitalization for psychiatric illness: Yes- admission for depression - received ECT - 10 years ago History of Electroconvulsive Shock Therapy: Yes- see above  Prior Suicide Attempts: No  Physical Exam: Constitutional:  BP 156/88 mmHg  Pulse 84  Ht  (1.702 m)  Wt 174 lb 3.2 oz (79.017 kg)  BMI 27.28 kg/m2  General Appearance: alert, oriented, no acute distress  Musculoskeletal: Strength & Muscle Tone: within normal limits Gait & Station: normal Jose Perez leans: N/A  Psychiatric: Speech  (describe rate, volume, coherence, spontaneity, and abnormalities if any):  Normal, of note, however, Jose Perez's presentation tends to subtly different after he relapses-  Clothing choices change, and he becomes vaguely, subtly irritable.  Thought Process (describe rate, content, abstract reasoning, and computation):  Normal, goal directed  Associations: Coherent and Intact  Thoughts: normal and  no hallucinations, no delusions, no SI or HI  Mental Status: Orientation: fully oriented  Mood & Affect: normal affect and denies depression at present. Subtly irritable ( this is a subtle change compared to his previous presentation and not very apparent )  Attention Span & Concentration:  Normal   Medical Decision Making (Choose Three): Review of Psycho-Social Stressors (1), Established Problem, Worsening (2) and Review of Medication Regimen & Side Effects (2)  Assessment: Jose Perez had relapsed on cannabis last year after his sister died , after being sober for 10 years or so. He recently relapsed again during a trip to [place] to visit his elderly mother.  His brother lives there and uses cannabis regularly and Jose Perez states he relapsed when his brother offered him cannabis. He then used again during a recent trip to [place]. Last used last week. Cannabis use tends to cause vague changes in personality, increased risk taking , and increased marital tension, particularly as wife is in recovery herself and very active in [place] .  He recently gambled during his trip to [place] and also recently agreed to go on a Mission trip to [place] that his now deceased sister had been involved in. Jose Perez states he wants to honor his sister's legacy by doing this.  Jose Perez not presenting with any mania or hypomania. Sleep good, no pressured speech, no restlessness,no grandiosity. He is denying depression and not endorsing neuro-vegetative symptoms of depression.  Axis I: MDD  By history, Social Anxiety/ Phobia by  History, Cannabis Dependence  II Deferred, III HTN. IV death of sister, mother medically ill, marital discord V [id number]   Plan:  I have encouraged full abstinence from cannabis .   Jose Perez to continue Effexor XR 225 mgrs QDAY and Buspar 10 mgrs BID- paper script given as this computer not linked to script printer. Will see in one month- agrees to contact me sooner if any worsening prior.  Nehemiah Massed, MD 08/04/2014

## 2014-09-15 ENCOUNTER — Ambulatory Visit (HOSPITAL_COMMUNITY): Payer: Self-pay | Admitting: Psychiatry

## 2014-09-22 ENCOUNTER — Encounter (HOSPITAL_COMMUNITY): Payer: Self-pay | Admitting: Psychiatry

## 2014-09-22 ENCOUNTER — Ambulatory Visit (INDEPENDENT_AMBULATORY_CARE_PROVIDER_SITE_OTHER): Payer: Medicare Other | Admitting: Psychiatry

## 2014-09-22 DIAGNOSIS — F1210 Cannabis abuse, uncomplicated: Secondary | ICD-10-CM

## 2014-09-22 DIAGNOSIS — F329 Major depressive disorder, single episode, unspecified: Secondary | ICD-10-CM | POA: Diagnosis not present

## 2014-09-22 DIAGNOSIS — F332 Major depressive disorder, recurrent severe without psychotic features: Secondary | ICD-10-CM

## 2014-09-22 MED ORDER — BUSPIRONE HCL 10 MG PO TABS: 10.0000 mg | ORAL_TABLET | Freq: Two times a day (BID) | ORAL | Status: DC

## 2014-09-22 NOTE — Progress Notes (Signed)
[facility] MD Progress Note  09/22/2014 6:54 PM Jose Perez  MRN:  [id number] Subjective:   Patient states he has been "OK". Denies depression at this time. 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 He states it was a very interesting experience , and he states he was appalled by the poverty and the difficulties the people there face.  He states , however, it was a wonderful experience and is hoping to be able to return.  Admits to episode of cannabis use when he returned , via [place], at which time he visited with his brother, who smokes cannabis regularly. Last used cannabis 12 days ago. Denies depression or sadness at this time- describes mood as stable at this time. 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 Objective : Patient came to session with his wife .  As noted, he currently minimizes symptoms of depression. Anxiety symptoms also reported as currently well controlled. He is tolerating medications well- Effexor XR and Buspar. We reviewed cannabis use at length- patient has a history of cannabis dependence, and has been using cannabis episodically over recent months, primarily when he visits his family in [place], and at least once when he travelled to a medical conference. Wife states that she knows when he uses because he tends to become more distant emotionally and vaguely more irritable. Patient has had long periods of sobriety in the past- up to 6 years - and has been in 12 step program in the past. He states " this time I really am tired of using, I want to stop". Encouraged to resume 12 step program participation. Wife also wants to travel with him to [place]- he is scheduled to visit family in late August- to help patient avoid cannabis use and to confront brother. Wife  states " he knows he has a problem with marihuana, as a brother he should not be offering it to him or using it in front of him".  Principal Problem: Depression- currently well controlled on medication. Cannabis Abuse . Diagnosis:   Patient Active Problem List   Diagnosis Date Noted  . Major depression [F32.2] 03/13/2014  . Cannabis dependence [F12.20] 03/13/2014   Total Time spent with patient: 30 minutes   Past Medical History:  Past Medical History  Diagnosis Date  . Cannabis dependence 03/13/2014  . Depression   . Hypertension 05/19/14   No past surgical history on file. Family History:  Family History  Problem Relation Age of Onset  . Depression Sister    Social History:  History  Alcohol Use No     History  Drug Use No    History   Social History  . Marital Status: Unknown    Spouse Name: N/A  . Number of Children: N/A  . Years of Education: N/A   Social History Main Topics  . Smoking status: Former Smoker    Quit date: 05/19/2009  . Smokeless tobacco: Never Used  . Alcohol Use: No  . Drug Use: No  . Sexual Activity: Yes   Other Topics Concern  . Not on file   Social History Narrative   Additional History:    Sleep: Good  Appetite:  Good   Assessment:   Musculoskeletal: Strength & Muscle Tone: within normal  limits Gait & Station: normal Patient leans: N/A   Psychiatric Specialty Exam: Physical Exam  ROS- no nausea, no vomiting endorsed .  There were no vitals taken for this visit.There is no weight on file to calculate BMI.  General Appearance: Well Groomed  Patent attorney::  Good  Speech:  Normal Rate  Volume:  Normal  Mood:  Euthymic  Affect:  Appropriate  Thought Process:  Linear  Orientation:  Full (Time, Place, and Person)  Thought Content:  no hallucinations, no delusions, not internally preoccupied   Suicidal Thoughts:  No  Homicidal Thoughts:  No  Memory:  recent and remote grossly intact   Judgement:  Good  Insight:   Present  Psychomotor Activity:  Normal  Concentration:  Good  Recall:  Good  Fund of Knowledge:Good  Language: Good  Akathisia:  Negative  Handed:  Right  AIMS (if indicated):     Assets:  Communication Skills Desire for Improvement Financial Resources/Insurance Housing Resilience  ADL's:  Intact  Cognition: WNL  Sleep:        Current Medications: Current Outpatient Prescriptions  Medication Sig Dispense Refill  . benazepril (LOTENSIN) 10 MG tablet Take 10 mg by mouth daily.    . busPIRone (BUSPAR) 10 MG tablet Take 1 tablet (10 mg total) by mouth 2 (two) times daily. 60 tablet 1  . venlafaxine XR (EFFEXOR XR) 75 MG 24 hr capsule Take 3 capsules (225 mg total) by mouth daily with breakfast. 90 capsule 1   No current facility-administered medications for this visit.    Lab Results: No results found for this or any previous visit (from the past 48 hour(s)).  Physical Findings: AIMS:  , ,  ,  ,    CIWA:    COWS:      Assessment- patient currently stable insofar as depression and anxiety and has tolerated Effexor XR well ( has been on this medication for years). He is now also on Buspar, and feels it has helped decrease anxiety. He has a history of Cannabis Dependence and over recent months has had several instances of using cannabis, particularly during trips away from home, and when seeing his brother in [place], who smokes cannabis regularly. Patient acknowledges he is tired of using and expresses motivation in sobriety. His use has caused increased tension with wife . States last used 2 weeks ago.   Treatment Plan Summary: Continue outpatient treatment- will see in 4-6 weeks - agrees to see me sooner if any worsening prior. Encouraged to abstain from cannabis and to go to 12 step meetings/ avoid people, places and situations he associates with drug use in order to decrease risk of relapse Continue Effexor XR 225 mgrs QDAY- does not currently need script and Buspar 10 mgrs  BID- script renewed via e script.    Medical Decision Making:  Established Problem, Stable/Improving (1), Review of Psycho-Social Stressors (1), Review or order clinical lab tests (1) and Review of Medication Regimen & Side Effects (2)     COBOS, [name] 09/22/2014, 6:54 PM

## 2014-10-27 ENCOUNTER — Ambulatory Visit (HOSPITAL_COMMUNITY): Payer: Self-pay | Admitting: Psychiatry

## 2014-11-03 ENCOUNTER — Ambulatory Visit (HOSPITAL_COMMUNITY): Payer: Self-pay | Admitting: Psychiatry

## 2014-11-10 ENCOUNTER — Ambulatory Visit (HOSPITAL_COMMUNITY): Payer: Self-pay | Admitting: Psychiatry

## 2014-11-17 ENCOUNTER — Ambulatory Visit (INDEPENDENT_AMBULATORY_CARE_PROVIDER_SITE_OTHER): Payer: Medicare Other | Admitting: Psychiatry

## 2014-11-17 VITALS — BP 154/92 | HR 80 | Ht 68.0 in | Wt 174.6 lb

## 2014-11-17 DIAGNOSIS — F3340 Major depressive disorder, recurrent, in remission, unspecified: Secondary | ICD-10-CM

## 2014-11-17 DIAGNOSIS — F1221 Cannabis dependence, in remission: Secondary | ICD-10-CM | POA: Diagnosis not present

## 2014-11-17 DIAGNOSIS — F331 Major depressive disorder, recurrent, moderate: Secondary | ICD-10-CM

## 2014-11-17 MED ORDER — VENLAFAXINE HCL ER 75 MG PO CP24: 225.0000 mg | ORAL_CAPSULE | Freq: Every day | ORAL | Status: DC

## 2014-11-18 ENCOUNTER — Encounter (HOSPITAL_COMMUNITY): Payer: Self-pay | Admitting: Psychiatry

## 2014-11-18 NOTE — Progress Notes (Signed)
[facility] MD Progress Note  11/18/2014 5:37 PM Jose Perez  MRN:  [id number] Subjective:   At this time patient is doing well. He is functioning well in daily activities , to include full time work as Development worker, community. Family life has normalized and become less tense as patient has remained sober/abstinent. Denies medication side effects. Principal Problem:  Major Depression, currently in remission. Cannabis Dependence, currently in early remission. Objective : Patient came in with his wife , who provided collateral information. At this time patient is doing well, and wife corroborates that she does not think he is using any cannabis recently. We spoke about cannabis abuse at length- patient states he last used  About 2 months ago, during his last trip to [place]. He states he enjoys the effects of cannabis because he feels less introverted, and feels more assertive. However, his wife and family experience his use differently- wife states he becomes " irritable and obnoxious and someone different from the man I love and married ".  Patient states he is gaining insight into this , and at this time denies any desire to use or any cravings . He does have a trip to [place] scheduled for 1 month from now, as he plans to see his elderly mother who lives there. States he has spoken with his brother ( who provides him with cannabis when he goes there- only uses during these trips out of his home )and told him  He is no longer wanting to use and brother has agreed not to offer or use in front of him. Wife states she would like to go with him to insure sobriety, but that " I can' be there with him all the time like a mother - he is an adult" Have encouraged patient to restart 12 step program participation, which he admits was very helpful in the past . Insofar as depression and anxiety, he is currently stable and euthymic, functioning well in daily activities- no neuro vegetative symptoms of depression at present  . Diagnosis:   Patient Active Problem List   Diagnosis Date Noted  . Major depression [F32.2] 03/13/2014  . Cannabis dependence [F12.20] 03/13/2014   Total Time spent with patient: 30 minutes   Past Medical History:  Past Medical History  Diagnosis Date  . Cannabis dependence 03/13/2014  . Depression   . Hypertension 05/19/14   No past surgical history on file. Family History:  Family History  Problem Relation Age of Onset  . Depression Sister    Social History:  History  Alcohol Use No     History  Drug Use No    Social History   Social History  . Marital Status: Unknown    Spouse Name: N/A  . Number of Children: N/A  . Years of Education: N/A   Social History Main Topics  . Smoking status: Former Smoker    Quit date: 05/19/2009  . Smokeless tobacco: Never Used  . Alcohol Use: No  . Drug Use: No  . Sexual Activity: Yes   Other Topics Concern  . Not on file   Social History Narrative   Additional History:    Sleep: Good  Appetite:  Good   Assessment:   Musculoskeletal: Strength & Muscle Tone: within normal limits Gait & Station: normal Patient leans: N/A   Psychiatric Specialty Exam: Physical Exam  ROS- no headache, no nausea, no vomiting   Blood pressure 154/92, pulse 80, height  (1.727 m), weight 174 lb 9.6 oz (79.198  kg).Body mass index is 26.55 kg/(m^2).  General Appearance: Well Groomed  Patent attorney::  Good  Speech:  Normal Rate  Volume:  Normal  Mood:  Euthymic  Affect:  Appropriate  Thought Process:  Linear  Orientation:  Full (Time, Place, and Person)  Thought Content:  no hallucinations, no delusions   Suicidal Thoughts:  No  Homicidal Thoughts:  No  Memory:  recent and remote grossly intact   Judgement:  Good  Insight:  Present  Psychomotor Activity:  EPS  Concentration:  Good  Recall:  Good  Fund of Knowledge:Good  Language: Good  Akathisia:  Negative  Handed:  Right  AIMS (if indicated):     Assets:   Communication Skills Desire for Improvement Housing Resilience Social Support Vocational/Educational  ADL's:  Intact  Cognition: WNL  Sleep:        Current Medications: Current Outpatient Prescriptions  Medication Sig Dispense Refill  . benazepril (LOTENSIN) 10 MG tablet Take 10 mg by mouth daily.    . busPIRone (BUSPAR) 10 MG tablet Take 1 tablet (10 mg total) by mouth 2 (two) times daily. 60 tablet 1  . venlafaxine XR (EFFEXOR XR) 75 MG 24 hr capsule Take 3 capsules (225 mg total) by mouth daily with breakfast. 90 capsule 1   No current facility-administered medications for this visit.    Lab Results: No results found for this or any previous visit (from the past 48 hour(s)).  Physical Findings: AIMS:  , ,  ,  ,    CIWA:    COWS:      Assessment- patient currently stable and euthymic. Now sober from cannabis x about 2 months, and motivated in maintaining sobriety, but not wanting to cancel trip to visit family in [place] in 1 month ( he has historically relapsed only during trips away from home and has obtained cannabis from brother in [place] during previous trips) . Patient states he has spoken with brother about not wanting to use cannabis any more. Tolerating medications well.  Treatment Plan Summary: Plan will see in 2-3 months, sooner if any worsening prior and continue Effexor XR and Buspar at current doses, as above    Medical Decision Making:  Established Problem, Stable/Improving (1), Review of Psycho-Social Stressors (1), Review or order clinical lab tests (1) and Review of Medication Regimen & Side Effects (2)     Jose Perez 11/18/2014, 5:37 PM

## 2014-12-29 ENCOUNTER — Ambulatory Visit (INDEPENDENT_AMBULATORY_CARE_PROVIDER_SITE_OTHER): Payer: Medicare Other | Admitting: Psychiatry

## 2014-12-29 VITALS — BP 135/97 | HR 100 | Ht 67.0 in | Wt 165.0 lb

## 2014-12-29 DIAGNOSIS — F1220 Cannabis dependence, uncomplicated: Secondary | ICD-10-CM

## 2014-12-29 DIAGNOSIS — F324 Major depressive disorder, single episode, in partial remission: Secondary | ICD-10-CM | POA: Diagnosis not present

## 2014-12-29 DIAGNOSIS — F332 Major depressive disorder, recurrent severe without psychotic features: Secondary | ICD-10-CM

## 2014-12-29 DIAGNOSIS — Z87891 Personal history of nicotine dependence: Secondary | ICD-10-CM | POA: Diagnosis not present

## 2014-12-29 MED ORDER — BUSPIRONE HCL 10 MG PO TABS: 10.0000 mg | ORAL_TABLET | Freq: Two times a day (BID) | ORAL | Status: DC

## 2014-12-29 MED ORDER — VENLAFAXINE HCL ER 75 MG PO CP24: 225.0000 mg | ORAL_CAPSULE | Freq: Every day | ORAL | Status: DC

## 2015-01-01 ENCOUNTER — Encounter (HOSPITAL_COMMUNITY): Payer: Self-pay | Admitting: Psychiatry

## 2015-01-01 NOTE — Progress Notes (Signed)
[facility] MD Progress Note  01/01/2015 7:33 PM Jose Perez  MRN:  [id number] Subjective:   Regarding depression patient states he has been doing "OK", and denies current depression or significant neuro-vegetative symptoms. Regarding Cannabis Abuse, he did relapse on his last trip to [place]. He had been sober for several weeks to months and had intention of staying sober, and reports he avoided his brother, as his brother smokes cannabis and has procured drug for him in the past . However, he had cravings , relapsed. Denies any use since trip/ return to this area . He states that his wife , who has a history of alcohol dependence , but who has been sober x 11 years and is very active in [place], is " really angry at me ", due to his ongoing use of cannabis, and his attempts to lie and cover his use to her . One issue is that patient states he is convinced that marihuana makes him feel better, be more extroverted, more fun loving, less timid. He does have a history of social anxiety . His wife, however , reports that when he smokes cannabis he becomes more irritable, less empathic, more prone to lie , and that he is not that timid or shy when he does not use drugs and that this is essentially an excuse he uses to rationalize his use . He denies medication side effects. ( Buspar and Effexor )  States he is functioning well in daily activities, working full time, planning on retirement in a year or two. Objective : We discussed issues as above - reviewed, using non confrontational strategies, the negative impact that cannabis has had for him, to include previous difficulties , suspension from Board, strain  With his wife , who has at times threatened divorce over his using cannabis, and concern that his adult children notice that something is " off " in his attitude when he uses, even though they do not know he is using . I encouraged him to consider full abstinence , and to go to NA or AA regularly, which helped  him in the past- we reviewed potential rehab , but he states he definitely does not feel he needs this level of treatment . He admits his motivation to stop cannabis at this time is partial, and related to external stressor of possible end of his marriage .  Of note, patient denies working under the influence and states only uses when travelling out of town - Insofar as depression, he is not depressed, and presents euthymic, tolerating medications well.  Principal Problem:  Major Depression , stable, in remission, Cannabis Abuse  Diagnosis:   Patient Active Problem List   Diagnosis Date Noted  . Major depression [F32.2] 03/13/2014  . Cannabis dependence [F12.20] 03/13/2014   Total Time spent with patient: 30 minutes   Past Medical History:  Past Medical History  Diagnosis Date  . Cannabis dependence 03/13/2014  . Depression   . Hypertension 05/19/14   No past surgical history on file. Family History:  Family History  Problem Relation Age of Onset  . Depression Sister    Social History:  History  Alcohol Use No     History  Drug Use No    Social History   Social History  . Marital Status: Unknown    Spouse Name: N/A  . Number of Children: N/A  . Years of Education: N/A   Social History Main Topics  . Smoking status: Former Smoker  Quit date: 05/19/2009  . Smokeless tobacco: Never Used  . Alcohol Use: No  . Drug Use: No  . Sexual Activity: Yes   Other Topics Concern  . Not on file   Social History Narrative   Additional History:    Sleep: Good  Appetite:  Good   Assessment:   Musculoskeletal: Strength & Muscle Tone: within normal limits Gait & Station: normal Patient leans: N/A   Psychiatric Specialty Exam: Physical Exam  ROS no chest pain, no shortness of breath, no vomiting, no diarrhea   Blood pressure 135/97, pulse 100, height  (1.702 m), weight 165 lb (74.844 kg).Body mass index is 25.84 kg/(m^2).  General Appearance: Well Groomed   Patent attorney::  Good  Speech:  Normal Rate  Volume:  Normal  Mood:  Euthymic  Affect:  Full Range  Thought Process:  Linear  Orientation:  Full (Time, Place, and Person)  Thought Content:  no hallucinations, no delusions   Suicidal Thoughts:  No  Homicidal Thoughts:  No  Memory:  recent and remote grossly intact   Judgement:  Other:  present   Insight:  Present- in a contemplative stage of change regarding cannabis abuse   Psychomotor Activity:  Normal  Concentration:  Good  Recall:  Good  Fund of Knowledge:Good  Language: Good  Akathisia:  Negative  Handed:  Right  AIMS (if indicated):     Assets:  Communication Skills Housing Physical Health Resilience Vocational/Educational  ADL's:  Intact  Cognition: WNL  Sleep:        Current Medications: Current Outpatient Prescriptions  Medication Sig Dispense Refill  . benazepril (LOTENSIN) 10 MG tablet Take 10 mg by mouth daily.    . busPIRone (BUSPAR) 10 MG tablet Take 1 tablet (10 mg total) by mouth 2 (two) times daily. 60 tablet 1  . venlafaxine XR (EFFEXOR XR) 75 MG 24 hr capsule Take 3 capsules (225 mg total) by mouth daily with breakfast. 90 capsule 1   No current facility-administered medications for this visit.    Lab Results: No results found for this or any previous visit (from the past 48 hour(s)).  Physical Findings: AIMS:  , ,  ,  ,    CIWA:    COWS:      Assessment - patient presents euthymic and denies neuro vegetative symptoms of depression. He has done well on Effexor XR / Buspar combination. He has had a number of relapses on cannabis, mostly when travelling out of town, which he does frequently to see his parents. He is not concerned about cannabis abuse, except for the fact that it may cause legal issues if caught and that it upsets his wife , who has threatened to divorce him if he does not stop. He presents in a contemplative stage of change- acknowledges he has a problem with cannabis, but has limited  intrinsic motivation to stop at this time. He is amenable to discuss these issues at length , and upon ending session said he was going to " try to go to some 12 step meetings ".    Treatment Plan Summary: Plan will see in 6 weeks , agrees to contact me sooner if any worsening prior and continue medications as below Effexor XR 225 mgrs QDAY for depression Buspar 10 mgrs BID for anxiety Continue to encourage sobriety, abstinence - have encouraged /advised 12 step program participation and avoiding people, places, situations he associates with drug use in order to decrease risk of relapse .  Medical  Decision Making:  Established Problem, Stable/Improving (1), Review of Psycho-Social Stressors (1), Review or order clinical lab tests (1) and Review of Medication Regimen & Side Effects (2)     COBOS, [name] 01/01/2015, 7:33 PM

## 2015-01-05 ENCOUNTER — Ambulatory Visit (HOSPITAL_COMMUNITY): Payer: Self-pay | Admitting: Psychiatry

## 2015-02-09 ENCOUNTER — Ambulatory Visit (HOSPITAL_COMMUNITY): Payer: Self-pay | Admitting: Psychiatry

## 2015-02-23 ENCOUNTER — Ambulatory Visit (HOSPITAL_COMMUNITY): Payer: Self-pay | Admitting: Psychiatry

## 2015-03-01 ENCOUNTER — Telehealth (HOSPITAL_COMMUNITY): Payer: Self-pay

## 2015-03-02 ENCOUNTER — Ambulatory Visit (HOSPITAL_COMMUNITY): Payer: Self-pay | Admitting: Psychiatry

## 2015-03-09 ENCOUNTER — Ambulatory Visit (HOSPITAL_COMMUNITY): Payer: Self-pay | Admitting: Psychiatry

## 2015-03-29 ENCOUNTER — Telehealth (HOSPITAL_COMMUNITY): Payer: Self-pay | Admitting: Psychiatry

## 2015-03-29 NOTE — Telephone Encounter (Signed)
PHONE CONTACT  03/29/15 at around 5,30 PM   Received call from patient's wife and I also spoke with patient and with one of his adult daughters, who is visiting them for the holidays , via wife's phone .  Wife expressed concern that patient has  Been more  irritable, argumentative recently ,  Referring to his recent presentation and behavior as " obnoxious"  over recent days, not sleeping as well as before. Reportedly , he recently  Got  a traffic ticket for speeding as well .  Wife states he has not been taking his prescribed medications in several weeks, and suspects he has been smoking cannabis recently, particularly during a recent  [place]ation [place] , from which they recently returned. Daughter corroborates mother's concerns and they express concern he appears to be " manic".  They deny any threatening or violent behavior on his part .  Patient himself  States he thinks he has been "OK" , and states he has been dealing with the recent death of his elderly mother and of his sister, both of whom died over recent weeks . States that due to his sister's death, who was only a little older than he is , he decided he needs to enjoy his life more, think of himself more, and that his family is misperceiving this as manic. He denies changes in sleep, denies any racing thoughts, and states his irritability is essentially due to his family not understanding him. He did not present with rapid speech,  No disorganized thought process , and denies any SI or HI,denies any violent ideations,  No  psychotic symptoms were endorsed or noted .  Does acknowledge not taking his medications over recent days, and states he did smoke cannabis while on vacation but not since return.  There are no grounds, based on the above for any involuntary commitment   I recommended that patient abstain from cannabis or any illicit drugs, and we set an appointment to see me on 12/27.  Since I will not be available  sooner than this , due to vacation, and due to level of  family concern, I have set up an appointment for patient to see Dr Ladona Ridgelaylor tomorrow at 1 PM a the outpatient clinic . Family also aware that if concern about his or their safety due to psychiatric symptoms they can commit him via magistrate and I have given [facility].  Nehemiah MassedFernando Cobos , MD

## 2015-03-30 ENCOUNTER — Telehealth (HOSPITAL_COMMUNITY): Payer: Self-pay

## 2015-03-30 NOTE — Telephone Encounter (Signed)
Telephone call from Dr. Jama Flavorsobos on vacation this week stating he had gotten a call on his personal cell phone from patient's wife, with patient in the background, and she reported patient was manic and needed to be seen.  Dr. Jama Flavorsobos reported he had instructed patient and wife on what to do to keep patient safe if she felt patient was a danger to self or others as patient denied any suicidal or homicidal ideations to Dr.Cobos with no plan or intent to harm self or others.  Agreed to call patient's home today to schedule earlier appointment with Dr. Ladona Ridgelaylor for an evaluation and possible needed medication changes.  Discussed with Dr. Ladona Ridgelaylor who agreed to see patient in Dr. Jama Flavorsobos absence this week.  Called and spoke to patient's wife to give appointment time but Ms. 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  Ms. Jose Perez was given a time at 2pm for 03/31/15 with Dr. Ladona Ridgelaylor as Dr. Ladona Ridgelaylor agreed to offer and reported she would try to convince patient to come in on 03/31/15 at 2 pm.  Collateral reported patient was displaying manic behaviors but denied a current danger to self or others.  Informed of process to petition on patient if collateral felt the need if he became a danger to self or others and to call the police.  Collateral reported Dr. Jama Flavorsobos had gone over these with her as well and knew what to do if any concern for her safety, patient's safety or anyone else's safety.  Stated she would call back on 03/31/15 if she is unable to convince patient to come in for an evaluation.  Informed again of petition process if needed and Ms. Jose Perez stated understanding and denies patient a danger to self or others at present.

## 2015-03-31 ENCOUNTER — Ambulatory Visit (HOSPITAL_COMMUNITY): Payer: Self-pay | Admitting: Psychiatry

## 2015-04-02 NOTE — Telephone Encounter (Signed)
Telephone call from Dr. Jama Flavorsobos to discuss what was offered to patient on 03/30/15 and then they called and cancelled appointment on 03/31/15.  Agreed to call patient to check on his status this date and to remind of appointment set for Dr. Jama Flavorsobos on 04/06/15 at 1:30pm.  Called and spoke with Ms. Leroy Libmaniszek as she stated "he's doing fine" and that they planned to keep appointment with Dr. Jama Flavorsobos on Tuesday 04/06/15 at 1:30pm.  Reiterated to Ms. Leroy LibmanCiszek if any concerns for patient safety or others how to petition on patient or to bring in for treatment with emergency's if needed and she stated understanding but did not think necessary at this time.  Reports they will keep appointment with Dr. Jama Flavorsobos on 04/06/15.

## 2015-04-06 ENCOUNTER — Encounter (HOSPITAL_COMMUNITY): Payer: Self-pay | Admitting: Psychiatry

## 2015-04-06 ENCOUNTER — Ambulatory Visit (INDEPENDENT_AMBULATORY_CARE_PROVIDER_SITE_OTHER): Payer: Medicare Other | Admitting: Psychiatry

## 2015-04-06 VITALS — BP 158/98 | HR 87 | Ht 67.0 in | Wt 169.8 lb

## 2015-04-06 DIAGNOSIS — F1210 Cannabis abuse, uncomplicated: Secondary | ICD-10-CM

## 2015-04-07 NOTE — Progress Notes (Unsigned)
[facility] MD Progress Note  04/07/2015 7:57 PM Jose Perez  MRN:  [id number] Subjective:  Patient reports he is currently doing " better ". Denies medication side effects. Objective :  I met with patient and with his family - two adult daughters, wife for family meeting x 45 minutes. Wife had recently contacted me due to concern patient was exhibiting manic behaviors, which patient denied. Patient agreed to appointment today and family meeting to discuss concerns. Family/ patient describe situation as follows- Patient had difficulties adjusting to the death of his sister two years ago. She was just two years older than he was and it caused him to re-evaluate his life and to think he needed to enjoy his life more, as he too could die young as his sister had.  He bought an expensive car, took a trip to [place]. This was exacerbated by the recent and unexpected death of his other sister, who was also only a few years older than he was .  In addition, he has been grieving the death of his mother, who passed away earlier this year. Patient has also been spending more time in [place], where his family resides , dealing with family members' declining health and with recent wakes/funerals. He has history of using cannabis with his brother there, and patient acknowledges that he has smoked cannabis with some regularity over the last several months, particularly when travelling . Family recently went on vacation to a Resort in [place]- patient used cannabis several times there. Coinciding with this trip He also stopped Effexor XR for several days to weeks , and may have had some venlafaxine WDL symptoms. In [place] patient was clearly quite irritable and family states he essentially ruined their vacations by being rude, belligerent, hyper critical.  Family states he slept little and was " not himself" Patient states that this was related to  His feeling attacked by family for his cannabis use . Since their  return from vacation, and particularly over the last week, there has been clear improvement , although family states he does remain irritable . He is sleeping better, is less easily angered. This improvement appears to be in the context of resuming his Effexor XR last week , and no further cannabis consumption since he returned from vacation. 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 At this time patient not presenting with pressured speech, restlessness, grandiosity, racing thoughts or disorganized thought process.  No current symptoms of mania noted . All family members were clear that patient " changes for the worse when he smokes marihuana", and that this drug causes him to become more " edgy", irritable, and less empathic.  Patient tends to perceive cannabis differently, as a substance that helps him feel less socially withdrawn . We discussed this at length, and family meeting became a substance abuse intervention- patient gradually became less defensive , and admitted he felt he was " addicted to cannabis".  Regarding psychiatric history, patient has no known prior history of mania and presentation in the past has been of depression , social phobia. He has been stable on Effexor XR and Buspar for years .   Principal Problem:  MDD, Cannabis Dependence Diagnosis:   Patient Active Problem List   Diagnosis Date Noted  . Major depression (HCC) [F32.9] 03/13/2014  . Cannabis dependence (HCC) [F12.20] 03/13/2014   Total Time spent with   patient:  45 minutes     Past Medical History:  Past Medical History  Diagnosis Date  . Cannabis dependence (HCC) 03/13/2014  . Depression   . Hypertension 05/19/14   No past surgical history on file. Family History:  Family History  Problem Relation Age of Onset  . Depression Sister     Social History:  History  Alcohol Use No      History  Drug Use No    Social History   Social History  . Marital Status: Unknown    Spouse Name: N/A  . Number of Children: N/A  . Years of Education: N/A   Social History Main Topics  . Smoking status: Former Smoker    Quit date: 05/19/2009  . Smokeless tobacco: Never Used  . Alcohol Use: No  . Drug Use: No  . Sexual Activity: Yes   Other Topics Concern  . None   Social History Narrative   Additional Social History:   Sleep: improved   Appetite:  Good  Current Medications: Current Outpatient Prescriptions  Medication Sig Dispense Refill  . benazepril (LOTENSIN) 10 MG tablet Take 10 mg by mouth daily.    . busPIRone (BUSPAR) 10 MG tablet Take 1 tablet (10 mg total) by mouth 2 (two) times daily. 60 tablet 1  . venlafaxine XR (EFFEXOR XR) 75 MG 24 hr capsule Take 3 capsules (225 mg total) by mouth daily with breakfast. 90 capsule 1   No current facility-administered medications for this visit.    Lab Results: No results found for this or any previous visit (from the past 48 hour(s)).  Physical Findings: AIMS:  , ,  ,  ,    CIWA:    COWS:     Musculoskeletal: Strength & Muscle Tone: within normal limits Gait & Station: normal Patient leans: N/A  Psychiatric Specialty Exam: ROS  Blood pressure 158/98, pulse 87, height 5' 7" (1.702 m), weight 169 lb 12.8 oz (77.021 kg).Body mass index is 26.59 kg/(m^2).  General Appearance: Well Groomed  Eye Contact::  Good  Speech:  Normal Rate  Volume:  Normal- not pressured or loud   Mood:  patient denies depression, and at this time does not present manic   Affect:  mildly irritable, particularly when confronted by family about cannabis use   Thought Process:  Linear- no flight of ideations  Orientation:  Full (Time, Place, and Person)  Thought Content:  no hallucinations, no delusions, not internally preoccupied, no grandiose ideations  Suicidal Thoughts:  No patient denies any suicidal ideations or any homicidal ,  violent ideations  Homicidal Thoughts:  No  Memory:  recent and remote grossly intact   Judgement:  Other:  improved   Insight:  Present- limited insight regarding negative impact cannabis has had on his relationships   Psychomotor Activity:  Normal  Concentration:  Good  Recall:  Good  Fund of Knowledge:Good  Language: Good  Akathisia:  Negative  Handed:  Right  AIMS (if indicated):     Assets:  Communication Skills Desire for Improvement Financial Resources/Insurance Housing Resilience  ADL's:  Intact  Cognition: WNL  Sleep:     Assessment- at this time patient not presenting with symptoms of mania or hypomania. Wife and family report clear change in character, increased irritability and hostility, decreased sleep during a recent vacation trip to [place] where patient had stopped taking Effexor prior to departing for vacation, and smoked cannabis regularly while there . Since his return, he states he has   not used any cannabis, and has restarted Effexor XR as of last week. Wife, family corroborate that in this context patient is making gradual, clear improvement. This is suggestive of substance induced hypomanic, manic symptoms, possibly aggravated by venlafaxine WDL.  As noted, symptoms of irritability, hypomania have abated even as he has restarted antidepressant . Patient is in contemplative stage of change regarding cannabis abuse - family intervention does seem to have helped improve insight / motivation partially.  - Treatment Plan Summary: Strongly encouraged patient to abstain from cannabis , alcohol , illicit drugs, and to restart 12 step program participation, which helped him remain sober for a long period of time in the past  Continue Effexor XR and Buspar at prescribed doses  Patient encouraged to see PCP for management of HTN- patient aware that Effexor XR can contribute to elevated BP- BP has been well controlled on antihypertensive medications. Will see in 1-2 weeks  for follow up .   ,  04/07/2015, 7:57 PM  

## 2015-04-15 ENCOUNTER — Ambulatory Visit (INDEPENDENT_AMBULATORY_CARE_PROVIDER_SITE_OTHER): Payer: Medicare Other | Admitting: Psychiatry

## 2015-04-15 ENCOUNTER — Encounter (HOSPITAL_COMMUNITY): Payer: Self-pay | Admitting: Psychiatry

## 2015-04-15 VITALS — BP 140/90 | HR 73 | Ht 68.0 in | Wt 170.2 lb

## 2015-04-15 DIAGNOSIS — F332 Major depressive disorder, recurrent severe without psychotic features: Secondary | ICD-10-CM

## 2015-04-15 DIAGNOSIS — F1220 Cannabis dependence, uncomplicated: Secondary | ICD-10-CM | POA: Diagnosis not present

## 2015-04-15 NOTE — Progress Notes (Signed)
[facility]  04/15/2015 6:31 PM Jose Perez  MRN:  [id number] Subjective:  Compared to last week, both patient and his wife report significant improvement- he has been less irritable- she states he has been calmer, more " his usual self ", not angry or hostile. Patient corroborates that he is feeling better, and attributes this in part to being off cannabis for about 3  weeks now ( last used on vacation in December ) , and also to being back on Effexor XR, which he had stopped for several days to weeks late last year. Of note he denies any symptoms of mania- sleeping well, no overspending, no racing thoughts , no high risk behaviors endorsed- wife corroborates . They both spoke about the negative impact that his substance abuse, and recent behavioral changes - which wife and patient now feel were mostly substance induced- have had on their relationship and his with the children. They both spoke about their commitment to their marriage and improving relationship. Of note patient is currently on leave of absence from work- plans to be off work for several more weeks, and is currently seriously considering retiring . Objective : Patient presents improved compared to prior presentation- at this time he is calm, pleasant, presents rather embarrassed regarding recent behaviors. As above, he denies current symptoms of mania - sleeping well, no racing thoughts, no high risk behaviors. He appears more insightful regarding the negative impact that cannabis use has had on him and his family relationships- today also reports he was charged with cannabis possession a few weeks ago in [place]. Denies any current medication side effects from Effexor XR or Buspar- has been on this antidepressant for many years, with good results . BP 140/90- patient is aware that Effexor XR can contribute to HTN, but states he does not think it is , but rather " stress I have been going through". Plans to speak with his PCP for  possible adjustment of antihypertensive medication. Denies cravings for cannabis at this time. He responds well to support, encouragement and emphasis on recovery , abstinence efforts . Of note, as he improves he is now  also working on smoking cessation. Principal Problem: As below - Diagnosis:   Patient Active Problem List   Diagnosis Date Noted  . Major depression (HCC) [F32.9] 03/13/2014  . Cannabis dependence (HCC) [F12.20] 03/13/2014   Total Time spent with patient: 35 minutes    Past Medical History:  Past Medical History  Diagnosis Date  . Cannabis dependence (HCC) 03/13/2014  . Depression   . Hypertension 05/19/14   No past surgical history on file. Family History:  Family History  Problem Relation Age of Onset  . Depression Sister    Social History:  History  Alcohol Use No     History  Drug Use No    Social History   Social History  . Marital Status: Unknown    Spouse Name: N/A  . Number of Children: N/A  . Years of Education: N/A   Social History Main Topics  . Smoking status: Former Smoker    Quit date: 05/19/2009  . Smokeless tobacco: Never Used  . Alcohol Use: No  . Drug Use: No  . Sexual Activity: Yes   Other Topics Concern  . Not on file   Social History Narrative   Additional Social History:   Sleep: improved   Appetite:  Good  Current Medications: Current Outpatient Prescriptions  Medication Sig Dispense Refill  . benazepril (LOTENSIN)  10 MG tablet Take 10 mg by mouth daily.    . busPIRone (BUSPAR) 10 MG tablet Take 1 tablet (10 mg total) by mouth 2 (two) times daily. 60 tablet 1  . venlafaxine XR (EFFEXOR XR) 75 MG 24 hr capsule Take 3 capsules (225 mg total) by mouth daily with breakfast. 90 capsule 1   No current facility-administered medications for this visit.    Lab Results: No results found for this or any previous visit (from the past 48 hour(s)).  Physical Findings: AIMS:  , ,  ,  ,    CIWA:    COWS:      Musculoskeletal: Strength & Muscle Tone: within normal limits Gait & Station: normal Patient leans: N/A  Psychiatric Specialty Exam: ROS  Blood pressure 140/90, pulse 73, height 5\' 8"  (1.727 m), weight 170 lb 3.2 oz (77.202 kg).Body mass index is 25.88 kg/(m^2).  General Appearance: Well Groomed  Patent attorney::  Good  Speech:  Normal Rate  Volume:  Normal  Mood:  improved, currently euthymic, no symptoms of mania at present   Affect:  Appropriate  Thought Process:  Linear  Orientation:  Full (Time, Place, and Person)  Thought Content:  denies hallucinations, no delusions  Suicidal Thoughts:  No- no suicidal or self injurious ideations  Homicidal Thoughts:  No  Memory:  recent and remote grossly intact   Judgement:  Other:  improved  Insight:  improved  Psychomotor Activity:  Normal  Concentration:  Good  Recall:  Good  Fund of Knowledge:Good  Language: Good  Akathisia:  Negative  Handed:  Right  AIMS (if indicated):     Assets:  Communication Skills Desire for Improvement Resilience  ADL's:  Intact  Cognition: WNL  Sleep:     Assessment - patient significantly improved compared to prior presentation, which appears to coincide with resumption of psychiatric medications, mainly Effexor XR and with no further use of cannabis . Today presents euthymic, and is not currently presenting or reporting any manic or hypomanic symptoms. Not irritable or angry- wife corroborates dramatic improvement over last week. As he improves he is starting to take better care of self, working on smoking cessation, and marital relationship, which had been tense due to recent symptoms , is improving. Treatment Plan Summary: Will see in three weeks, patient agrees to contact me sooner if any worsening prior I emphasized importance of maintaining abstinence from cannabis, and patient more insightful and motivated- planning on starting to attend AA/ NA . Continue EFFEXOR XR 225 mgrs QDAY and BUSPAR 10  mgrs BID- does not currently need scripts .   Shae Hinnenkamp 04/15/2015, 6:31 PM

## 2015-04-19 ENCOUNTER — Telehealth (HOSPITAL_COMMUNITY): Payer: Self-pay

## 2015-04-19 DIAGNOSIS — F332 Major depressive disorder, recurrent severe without psychotic features: Secondary | ICD-10-CM

## 2015-04-19 MED ORDER — VENLAFAXINE HCL ER 75 MG PO CP24: 225.0000 mg | ORAL_CAPSULE | Freq: Every day | ORAL | Status: DC

## 2015-04-19 MED ORDER — BUSPIRONE HCL 10 MG PO TABS: 10.0000 mg | ORAL_TABLET | Freq: Two times a day (BID) | ORAL | Status: DC

## 2015-04-19 NOTE — Telephone Encounter (Signed)
Telephone call with Dr. Cobos to inform patient has left a message stJama Flavorsating he does need refills of his prescribed Buspar and Effexor XR.  Dr. Jama Flavorsobos verified dosages and e-scribed in new orders of both plus one 1 refill to patient's [facility] [place], [place] on [address]  Left patient a message these orders were sent to his pharmacy and to call back if questions

## 2015-04-23 ENCOUNTER — Telehealth (HOSPITAL_COMMUNITY): Payer: Self-pay

## 2015-04-23 NOTE — Telephone Encounter (Signed)
Patient called requesting call back. I called patient back at about  4, 45 PM on 04/23/15.  Reports some increased anxiety, which he describes as a sense of apprehension /worry, over recent days. Patient feels some of this may be related to being off cannabis , which had been masking, attenuating anxiety symptoms, and also related to upcoming trip to [place].  [place] , for support. No SI . We discussed options- patient to continue Effexor XR and Buspar, and may try Hydroxyzine 25 mgrs Q 12 hours PRN for increased anxiety. Offered patient to see him early next week in outpatient clinic- patient states he will call back if anxiety worsens or persists. Patient to go to ED if any severe worsening of symptoms or any SI emerges .  Sallyanne HaversF Corleone Biegler , MD

## 2015-04-26 ENCOUNTER — Telehealth (HOSPITAL_COMMUNITY): Payer: Self-pay

## 2015-04-27 ENCOUNTER — Telehealth (HOSPITAL_COMMUNITY): Payer: Self-pay

## 2015-04-28 ENCOUNTER — Encounter (HOSPITAL_COMMUNITY): Payer: Self-pay | Admitting: Psychiatry

## 2015-04-28 ENCOUNTER — Ambulatory Visit (HOSPITAL_COMMUNITY): Payer: Medicare Other | Admitting: Psychiatry

## 2015-04-28 ENCOUNTER — Telehealth (HOSPITAL_COMMUNITY): Payer: Self-pay

## 2015-04-28 VITALS — BP 128/78 | HR 100 | Ht 68.0 in | Wt 167.8 lb

## 2015-04-28 DIAGNOSIS — F332 Major depressive disorder, recurrent severe without psychotic features: Secondary | ICD-10-CM

## 2015-04-28 NOTE — Progress Notes (Unsigned)
[facility] MD Progress Note  04/28/2015 5:58 PM JOHNTHAN Perez  MRN:  [id number] Subjective:  Patient reports increasing depression, and to a lesser degree anxiety, over recent days . States he has continued to take prescribed medications- no side effects. Reports he has been abstinent from cannabis , and has not relapsed . Objective : Patient and wife called requesting to talk to me- I spoke with them and they reported, as above, that he has been feeling more depressed recently. Due to this we made appointment for today. Patient presents  with increased sense of sadness, low energy level , more difficulty sleeping, and decreased energy level. Also describes worsening anhedonia. Patient denies any symptoms of psychosis- no hallucinations, and denies any suicidal ideations, although he has had passive thoughts such as feeling that he has been a burden to family . Denies any plan or intention of hurting self or of SI, and states his sense of responsibility to his children and his wanting to meet his yet unborn grandchildren are protective factors from considering suicide . He does express concern that he is " sinking " into worsening depression, as above. Patient denies any further cannabis abuse, and states he has been sober for a few weeks . Denies any other drug or alcohol abuse . Wife , who came with him to appointment , corroborates increased depression and social isolation. Patient has been seeing me for approx 10 years and he has a history of severe episode of depression, which required lengthy psychiatric admission, back in 2005  Or 2006. At that time he responded to ECT which he feels  Was well tolerated and " was what finally got me out of depression then". Since then he has had several years of being asymptomatic , stable on Effexor XR, which he continues to take. As noted over recent months there have been increased symptoms related to grieving the deaths of his sisters, mother, and to relapse on  cannabis. We discussed options- I offered patient voluntary admission to inpatient unit- at this time no grounds for commitment - he preferred not to come in to hospital at this time but stated he would if any further worsening. Expressed interest in restarting ECT , hopefully as outpatient . With his express consent I discussed case with Dr. Patterson Hammersmith, at [facility], who provides ECT services- he agreed to see patient for ECT consultation this Friday ( two days from now) at 1, 00 PM. Patient expressed relief with this plan and stated he was hopeful ECT would be as helpful now as it was in the past .  Principal Problem:  Major Depression, Recurrent, Social Phobia by History, Cannabis Dependence  Diagnosis:   Patient Active Problem List   Diagnosis Date Noted  . Major depression (HCC) [F32.9] 03/13/2014  . Cannabis dependence (HCC) [F12.20] 03/13/2014   Total Time spent with patient: 30 minutes  Past Psychiatric History:  MDD, Cannabis Abuse  history  Past Medical History:  Past Medical History  Diagnosis Date  . Cannabis dependence (HCC) 03/13/2014  . Depression   . Hypertension 05/19/14   No past surgical history on file. Family History:  Family History  Problem Relation Age of Onset  . Depression Sister     Social History:  History  Alcohol Use No     History  Drug Use No    Social History   Social History  . Marital Status: Unknown    Spouse Name: N/A  . Number of Children: N/A  .  Years of Education: N/A   Social History Main Topics  . Smoking status: Former Smoker    Quit date: 05/19/2009  . Smokeless tobacco: Never Used  . Alcohol Use: No  . Drug Use: No  . Sexual Activity: Yes   Other Topics Concern  . Not on file   Social History Narrative   Additional Social History:   Sleep: Fair  Appetite:  Fair  Current Medications: Current Outpatient Prescriptions  Medication Sig Dispense Refill  . benazepril (LOTENSIN) 10 MG tablet Take 10 mg by mouth  daily.    . busPIRone (BUSPAR) 10 MG tablet Take 1 tablet (10 mg total) by mouth 2 (two) times daily. 60 tablet 1  . venlafaxine XR (EFFEXOR XR) 75 MG 24 hr capsule Take 3 capsules (225 mg total) by mouth daily with breakfast. 90 capsule 1   No current facility-administered medications for this visit.    Lab Results: No results found for this or any previous visit (from the past 48 hour(s)).  Physical Findings: AIMS:  , ,  ,  ,    CIWA:    COWS:     Musculoskeletal: Strength & Muscle Tone: within normal limits Gait & Station: normal Patient leans: N/A  Psychiatric Specialty Exam: ROS  Blood pressure 128/78, pulse 100, height  (1.727 m), weight 167 lb 12.8 oz (76.114 kg).Body mass index is 25.52 kg/(m^2).  General Appearance: Neat and Well Groomed  Eye Contact::  Good  Speech:  Normal Rate  Volume:  Decreased  Mood:  Depressed  Affect:  Constricted, but does smile at times appropriately  Thought Process:  Linear  Orientation:  Full (Time, Place, and Person)  Thought Content:  no hallucinations, no delusions   Suicidal Thoughts:  No at this time denies suicidal ideations - see above   Homicidal Thoughts:  No  Memory:  recent and remote grossly intact   Judgement:  Good  Insight:  Present  Psychomotor Activity:  Decreased  Concentration:  Good  Recall:  Good  Fund of Knowledge:Good  Language: Good  Akathisia:  Negative  Handed:  Right  AIMS (if indicated):     Assets:  Communication Skills Desire for Improvement Resilience Social Support  ADL's:  Intact  Cognition: WNL  Sleep:      Treatment Plan Summary: At this time patient prefers ongoing outpatient treatment - plans to see Dr Patterson Hammersmith at [facility] on Friday at 1,00 PM for outpatient ECT assessment- is hoping to start outpatient ECT course soon, based on history of good response and tolerance to this treatment modality.  I have discussed case with Dr. Patterson Hammersmith . Patient agrees to seek inpatient admission  if any ongoing worsening of depression or if any emergence of suicidal ideations, which he currently denies  For now continue Effexor XR 225 mgrs QDAY and Buspar 10 mgrs BID- does not need script at this time Patient will make appointment to see me in 1-2 weeks.   COBOS, [name] 04/28/2015, 5:58 PM

## 2015-04-30 ENCOUNTER — Encounter: Payer: Self-pay | Admitting: Psychiatry

## 2015-04-30 ENCOUNTER — Ambulatory Visit (INDEPENDENT_AMBULATORY_CARE_PROVIDER_SITE_OTHER): Payer: BLUE CROSS/BLUE SHIELD | Admitting: Psychiatry

## 2015-04-30 ENCOUNTER — Ambulatory Visit
Admission: RE | Admit: 2015-04-30 | Discharge: 2015-04-30 | Disposition: A | Discharge status: Home / Self Care | Payer: BLUE CROSS/BLUE SHIELD | Source: Ambulatory Visit | Attending: Psychiatry | Admitting: Psychiatry

## 2015-04-30 ENCOUNTER — Telehealth: Payer: Self-pay | Admitting: *Deleted

## 2015-04-30 ENCOUNTER — Encounter
Admission: RE | Admit: 2015-04-30 | Discharge: 2015-04-30 | Disposition: A | Discharge status: Home / Self Care | Payer: BLUE CROSS/BLUE SHIELD | Source: Ambulatory Visit | Attending: Psychiatry | Admitting: Psychiatry

## 2015-04-30 VITALS — BP 140/90 | HR 116 | Temp 97.0°F | Ht 68.0 in | Wt 167.6 lb

## 2015-04-30 DIAGNOSIS — F332 Major depressive disorder, recurrent severe without psychotic features: Secondary | ICD-10-CM

## 2015-04-30 DIAGNOSIS — Z01818 Encounter for other preprocedural examination: Secondary | ICD-10-CM | POA: Insufficient documentation

## 2015-04-30 DIAGNOSIS — I10 Essential (primary) hypertension: Secondary | ICD-10-CM | POA: Diagnosis not present

## 2015-04-30 DIAGNOSIS — Z01811 Encounter for preprocedural respiratory examination: Secondary | ICD-10-CM | POA: Insufficient documentation

## 2015-04-30 DIAGNOSIS — Z01810 Encounter for preprocedural cardiovascular examination: Secondary | ICD-10-CM | POA: Diagnosis not present

## 2015-04-30 LAB — CBC
HCT: 48.2 % (ref 40.0–52.0)
HEMOGLOBIN: 16.2 g/dL (ref 13.0–18.0)
MCH: 30.7 pg (ref 26.0–34.0)
MCHC: 33.6 g/dL (ref 32.0–36.0)
MCV: 91.4 fL (ref 80.0–100.0)
PLATELETS: 198 10*3/uL (ref 150–440)
RBC: 5.27 MIL/uL (ref 4.40–5.90)
RDW: 13 % (ref 11.5–14.5)
WBC: 12.8 10*3/uL — AB (ref 3.8–10.6)

## 2015-04-30 LAB — URINALYSIS COMPLETE WITH MICROSCOPIC (ARMC ONLY)
Bacteria, UA: NONE SEEN
Bilirubin Urine: NEGATIVE
Glucose, UA: NEGATIVE mg/dL
Hgb urine dipstick: NEGATIVE
Ketones, ur: NEGATIVE mg/dL
Leukocytes, UA: NEGATIVE
Nitrite: NEGATIVE
Protein, ur: NEGATIVE mg/dL
Specific Gravity, Urine: 1.008 (ref 1.005–1.030)
Squamous Epithelial / HPF: NONE SEEN
pH: 5 (ref 5.0–8.0)

## 2015-04-30 LAB — BASIC METABOLIC PANEL
ANION GAP: 8 (ref 5–15)
BUN: 17 mg/dL (ref 6–20)
CHLORIDE: 104 mmol/L (ref 101–111)
CO2: 27 mmol/L (ref 22–32)
Calcium: 9.4 mg/dL (ref 8.9–10.3)
Creatinine, Ser: 1.11 mg/dL (ref 0.61–1.24)
Glucose, Bld: 101 mg/dL — ABNORMAL HIGH (ref 65–99)
POTASSIUM: 4.1 mmol/L (ref 3.5–5.1)
SODIUM: 139 mmol/L (ref 135–145)

## 2015-04-30 NOTE — Progress Notes (Deleted)
[facility] MD Progress Note  04/30/2015 1:51 PM Jose Perez  MRN:  [id number] Subjective:  This is an intake ECT consult for this 65 year old man referred from a psychiatrist in the [facility] system for possible ECT. Principal Problem: @ Diagnosis:   Patient Active Problem List   Diagnosis Date Noted  . Major depression (HCC) [F32.9] 03/13/2014  . Cannabis dependence (HCC) [F12.20] 03/13/2014   Total Time spent with patient: {Time; 15 min - 8 hours:17441}  Past Psychiatric History: ***  Past Medical History:  Past Medical History  Diagnosis Date  . Cannabis dependence (HCC) 03/13/2014  . Depression   . Hypertension 05/19/14   History reviewed. No pertinent past surgical history. Family History:  Family History  Problem Relation Age of Onset  . Depression Sister   . Schizophrenia Sister   . Heart attack Father    Family Psychiatric  History: *** Social History:  History  Alcohol Use No     History  Drug Use  . Yes  . Special: Marijuana    Comment: last used about a month and half ago    Social History   Social History  . Marital Status: Unknown    Spouse Name: N/A  . Number of Children: N/A  . Years of Education: N/A   Social History Main Topics  . Smoking status: Current Every Day Smoker    Types: Cigarettes  . Smokeless tobacco: Never Used  . Alcohol Use: No  . Drug Use: Yes    Special: Marijuana     Comment: last used about a month and half ago  . Sexual Activity: Not Currently   Other Topics Concern  . None   Social History Narrative   Additional Social History:                         Sleep: {BHH GOOD/FAIR/POOR:22877}  Appetite:  {BHH GOOD/FAIR/POOR:22877}  Current Medications: Current Outpatient Prescriptions  Medication Sig Dispense Refill  . benazepril (LOTENSIN) 10 MG tablet Take 10 mg by mouth daily.    . busPIRone (BUSPAR) 10 MG tablet Take 1 tablet (10 mg total) by mouth 2 (two) times daily. 60 tablet 1  . venlafaxine  XR (EFFEXOR XR) 75 MG 24 hr capsule Take 3 capsules (225 mg total) by mouth daily with breakfast. 90 capsule 1   No current facility-administered medications for this visit.    Lab Results: No results found for this or any previous visit (from the past 48 hour(s)).  Physical Findings: AIMS:  , ,  ,  ,    CIWA:    COWS:     Musculoskeletal: Strength & Muscle Tone: {desc; muscle tone:32375} Gait & Station: {PE GAIT ED NATL:22525} Patient leans: {Patient Leans:21022755}  Psychiatric Specialty Exam: ROS  Blood pressure 140/90, pulse 116, temperature 97 F (36.1 C), temperature source Tympanic, height  (1.727 m), weight 167 lb 9.6 oz (76.023 kg), SpO2 96 %.Body mass index is 25.49 kg/(m^2).  General Appearance: {Appearance:22683}  Eye Contact::  {BHH EYE CONTACT:22684}  Speech:  {Speech:22685}  Volume:  {Volume (PAA):22686}  Mood:  {BHH MOOD:22306}  Affect:  {Affect (PAA):22687}  Thought Process:  {Thought Process (PAA):22688}  Orientation:  {BHH ORIENTATION (PAA):22689}  Thought Content:  {Thought Content:22690}  Suicidal Thoughts:  {ST/HT (PAA):22692}  Homicidal Thoughts:  {ST/HT (PAA):22692}  Memory:  {BHH WUJWJX:91478}  Judgement:  {Judgement (PAA):22694}  Insight:  {Insight (PAA):22695}  Psychomotor Activity:  {Psychomotor (PAA):22696}  Concentration:  {BHH GOOD/FAIR/POOR:22877}  Recall:  {BHH GOOD/FAIR/POOR:22877}  Fund of Knowledge:{BHH GOOD/FAIR/POOR:22877}  Language: {BHH GOOD/FAIR/POOR:22877}  Akathisia:  {BHH YES OR NO:22294}  Handed:  {Handed:22697}  AIMS (if indicated):     Assets:  {Assets (PAA):22698}  ADL's:  {BHH ZOX'W:96045}  Cognition: {chl bhh cognition:304700322}  Sleep:      Treatment Plan Summary: {CHL [facility] MD [place]. WUJW:119147829}  Jose Perez 04/30/2015, 1:51 PM

## 2015-04-30 NOTE — Progress Notes (Signed)
Psychiatric Initial Adult Assessment   Patient Identification: Jose Perez MRN:  [id number] Date of Evaluation:  04/30/2015 Referral Source: Dr Jama Flavors Chief Complaint:   Chief Complaint    Establish Care; Other; Depression; Stress; Fatigue     Visit Diagnosis:    ICD-9-CM ICD-10-CM   1. Severe recurrent major depression without psychotic features (HCC) 296.33 F33.2   2. Essential hypertension 401.9 I10    Diagnosis:   Patient Active Problem List   Diagnosis Date Noted  . Major depression (HCC) [F32.9] 03/13/2014  . Cannabis dependence (HCC) [F12.20] 03/13/2014   History of Present Illness:  65 year old man referred for ECT consult. Patient reports current symptoms of depressed mood, lack of motivation and interest in normal activities, fatigue during the day, lack of concentration, poor appetite, difficulty sleeping, feelings of hopelessness and helplessness. Symptoms have been present since late 02-25-2023 but have been severe for about a month. Patient does not report having any active suicidal intent or plan. Has had passive thoughts of morbidity but without any thought of suicide. Patient does not report any auditory or visual hallucinations or delusions. He is currently seeing an outpatient psychiatrist and is being treated with venlafaxine extended release 225 mg per day and buspirone 10 mg twice a day. He has been on these medicines for more than a month. There has been no improvement in his symptoms. Patient is currently unable to work at his usual job. Relations with his family and social activities are severely impaired. As far as medical symptoms patient is reporting loss of appetite and uncertain weight loss. He has a history of hypertension that is currently treated with appropriate medication. No history of heart disease or stroke. Patient reports that he has had a history of overuse of marijuana in the past. This was present until about 6 weeks ago. He says he has had no  marijuana in the last 6 weeks and denies abuse of any other drugs and denies regular alcohol use. Patient is requesting evaluation for ECT based on previous positive response. About 10 years ago he had ECT at a hospital in [place] for similar symptoms of depression with a good response and minimal complications. Elements:  Quality:  severe depression without psychosis.. Severity:  severe in terms of impairment to work and social life.. Timing:  present forced 4-6 weeks at least. Duration:  see above. Has been going on for weeks. He has a past history of prior episodes of depression going back decades.. Context:  most recent major stress was the death of his mother and sister both of whom passed away in 02-25-2015. Associated Signs/Symptoms: Depression Symptoms:  depressed mood, anhedonia, insomnia, psychomotor retardation, fatigue, feelings of worthlessness/guilt, difficulty concentrating, hopelessness, recurrent thoughts of death, anxiety, loss of energy/fatigue, disturbed sleep, decreased appetite, (Hypo) Manic Symptoms:  none Anxiety Symptoms:  Excessive Worry, Psychotic Symptoms:  none PTSD Symptoms: Negative  Past Medical History:  Past Medical History  Diagnosis Date  . Cannabis dependence (HCC) 03/13/2014  . Depression   . Hypertension 05/19/14   History reviewed. No pertinent past surgical history. Family History:  Family History  Problem Relation Age of Onset  . Depression Sister   . Schizophrenia Sister   . Heart attack Father    Social History:   Social History   Social History  . Marital Status: Unknown    Spouse Name: N/A  . Number of Children: N/A  . Years of Education: N/A   Social History Main Topics  . Smoking status:  Current Every Day Smoker    Types: Cigarettes  . Smokeless tobacco: Never Used  . Alcohol Use: No  . Drug Use: Yes    Special: Marijuana     Comment: last used about a month and half ago  . Sexual Activity: Not Currently    Other Topics Concern  . None   Social History Narrative   Additional Social History: Patient is married. Lives with his wife. They live in [place] but are willing to make the commute for ECT. 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 As noted previously recently lost 2 close relatives  Musculoskeletal: Strength & Muscle Tone: within normal limits Gait & Station: normal Patient leans: N/A  Psychiatric Specialty Exam: HPI  ROS  Blood pressure 140/90, pulse 116, temperature 97 F (36.1 C), temperature source Tympanic, height  (1.727 m), weight 167 lb 9.6 oz (76.023 kg), SpO2 96 %.Body mass index is 25.49 kg/(m^2).  General Appearance: Well Groomed  Eye Contact:  Fair  Speech:  Slow  Volume:  Decreased  Mood:  Depressed  Affect:  Depressed  Thought Process:  Goal Directed  Orientation:  Full (Time, Place, and Person)  Thought Content:  Negative  Suicidal Thoughts:  No  Homicidal Thoughts:  No  Memory:  Immediate;   Good Recent;   Fair Remote;   Fair  Judgement:  Intact  Insight:  Good  Psychomotor Activity:  Psychomotor Retardation  Concentration:  Fair  Recall:  Fair  Fund of Knowledge:Fair  Language: Fair  Akathisia:  No  Handed:  Right  AIMS (if indicated):     Assets:  Communication Skills Desire for Improvement Financial Resources/Insurance Housing Intimacy Physical Health Resilience Social Support Talents/Skills Transportation  ADL's:  Intact  Cognition: WNL  Sleep:  poor   Is the patient at risk to self?  No. Has the patient been a risk to self in the past 6 months?  No. Has the patient been a risk to self within the distant past?  No. Is the patient a risk to others?  No. Has the patient been a risk to others in the past 6 months?  No. Has the patient been a risk to others within the distant past?  No.  Allergies:  No Known Allergies Current Medications: Current Outpatient Prescriptions  Medication  Sig Dispense Refill  . benazepril (LOTENSIN) 10 MG tablet Take 10 mg by mouth daily.    . busPIRone (BUSPAR) 10 MG tablet Take 1 tablet (10 mg total) by mouth 2 (two) times daily. 60 tablet 1  . venlafaxine XR (EFFEXOR XR) 75 MG 24 hr capsule Take 3 capsules (225 mg total) by mouth daily with breakfast. 90 capsule 1   No current facility-administered medications for this visit.    Previous Psychotropic Medications: Yes   Substance Abuse History in the last 12 months:  Yes.    Consequences of Substance Abuse: NA  Medical Decision Making:  Review of Psycho-Social Stressors (1), Review or order clinical lab tests (1), Established Problem, Worsening (2), Review of Last Therapy Session (1) and Review of Medication Regimen & Side Effects (2)  Treatment Plan Summary: Plan patient is an appropriate candidate for ECT based on his diagnosis of severe recurrent depression, his lack of response to current appropriate treatment, and his previous good response and good toleration of ECT. Patient was offered the opportunity to begin ECT treatment. He was educated about the procedure and the risks and benefits. He and his  wife were given opportunity to ask all the questions they wanted. Patient states that he would like to proceed with treatment. Plan will be to order basic labs including chemistry panel, complete blood count, urinalysis, chest x-ray and EKG. We will pass on his information to utilization review and to the ECT service. We are tentatively placing him on the schedule to begin treatment next Wednesday, January 25. Patient will be in touch with [place] and we will contact him about specific timing. I will pass on this information to his outpatient psychiatrist as well. He can be in touch if needed. No other office appointment scheduled at this time. Patient agrees to the plan.    Maureena Dabbs 1/20/20171:53 PM

## 2015-05-05 ENCOUNTER — Encounter: Payer: Self-pay | Admitting: Anesthesiology

## 2015-05-05 ENCOUNTER — Encounter: Payer: BLUE CROSS/BLUE SHIELD | Admitting: Anesthesiology

## 2015-05-05 ENCOUNTER — Telehealth (HOSPITAL_COMMUNITY): Payer: Self-pay | Admitting: *Deleted

## 2015-05-05 ENCOUNTER — Encounter
Admission: RE | Admit: 2015-05-05 | Discharge: 2015-05-05 | Disposition: A | Discharge status: Home / Self Care | Payer: BLUE CROSS/BLUE SHIELD | Source: Ambulatory Visit | Attending: Psychiatry | Admitting: Psychiatry

## 2015-05-05 DIAGNOSIS — F17200 Nicotine dependence, unspecified, uncomplicated: Secondary | ICD-10-CM | POA: Insufficient documentation

## 2015-05-05 DIAGNOSIS — I10 Essential (primary) hypertension: Secondary | ICD-10-CM | POA: Diagnosis not present

## 2015-05-05 DIAGNOSIS — F332 Major depressive disorder, recurrent severe without psychotic features: Secondary | ICD-10-CM | POA: Insufficient documentation

## 2015-05-05 DIAGNOSIS — F1220 Cannabis dependence, uncomplicated: Secondary | ICD-10-CM | POA: Diagnosis not present

## 2015-05-05 MED ORDER — DEXTROSE 5 % IV SOLN
INTRAVENOUS | Status: DC | PRN
  Administered 2015-05-05: 11:00:00 via INTRAVENOUS

## 2015-05-05 MED ORDER — SODIUM CHLORIDE 0.9 % IV SOLN: 250.0000 mL | Freq: Once | INTRAVENOUS | Status: DC

## 2015-05-05 MED ORDER — KETOROLAC TROMETHAMINE 30 MG/ML IJ SOLN
30.0000 mg | Freq: Once | INTRAMUSCULAR | Status: AC
  Administered 2015-05-05: 30 mg via INTRAVENOUS

## 2015-05-05 MED ORDER — SUCCINYLCHOLINE 20MG/ML (10ML) SYRINGE FOR MEDFUSION PUMP - OPTIME
INTRAMUSCULAR | Status: DC | PRN
  Administered 2015-05-05: 90 mg via INTRAVENOUS

## 2015-05-05 MED ORDER — METHOHEXITAL SODIUM 100 MG/10ML IV SOSY
PREFILLED_SYRINGE | INTRAVENOUS | Status: DC | PRN
  Administered 2015-05-05: 80 mg via INTRAVENOUS

## 2015-05-05 MED ORDER — DEXTROSE 5 % IV SOLN
250.0000 mL | Freq: Once | INTRAVENOUS | Status: AC
  Administered 2015-05-05: 250 mL via INTRAVENOUS

## 2015-05-05 NOTE — H&P (Signed)
Jose Perez is an 65 y.o. male.   Chief Complaint: Patient beginning course of ECT for severe refractory depression. Continues with depressed mood physically unremarkable HPI: Patient with severe recurrent depression presenting for ECT treatment.  Past Medical History  Diagnosis Date  . Cannabis dependence (HCC) 03/13/2014  . Depression   . Hypertension 05/19/14    Past Surgical History  Procedure Laterality Date  . No past surgeries      Family History  Problem Relation Age of Onset  . Depression Sister   . Schizophrenia Sister   . Heart attack Father    Social History:  reports that he has been smoking Cigarettes.  He has been smoking about 0.50 packs per day. He has never used smokeless tobacco. He reports that he uses illicit drugs (Marijuana). He reports that he does not drink alcohol.  Allergies: No Known Allergies   (Not in a hospital admission)  No results found for this or any previous visit (from the past 48 hour(s)). No results found.  Review of Systems  Constitutional: Negative.   HENT: Negative.   Eyes: Negative.   Respiratory: Negative.   Cardiovascular: Negative.   Gastrointestinal: Negative.   Musculoskeletal: Negative.   Skin: Negative.   Neurological: Negative.   Psychiatric/Behavioral: Positive for depression and memory loss. Negative for suicidal ideas, hallucinations and substance abuse. The patient is nervous/anxious and has insomnia.     Blood pressure 138/93, pulse 79, temperature 96.4 F (35.8 C), temperature source Oral, resp. rate 16, weight 76.204 kg (168 lb), SpO2 100 %. Physical Exam  Nursing note and vitals reviewed. Constitutional: He appears well-developed and well-nourished.  HENT:  Head: Normocephalic and atraumatic.  Eyes: Conjunctivae are normal. Pupils are equal, round, and reactive to light.  Neck: Normal range of motion.  Cardiovascular: Normal rate, regular rhythm and normal heart sounds.   Respiratory: Effort normal  and breath sounds normal.  GI: Soft.  Musculoskeletal: Normal range of motion.  Neurological: He is alert.  Skin: Skin is warm and dry.  Psychiatric: Judgment and thought content normal. His speech is delayed. He is slowed. Cognition and memory are normal. He exhibits a depressed mood.     Assessment/Plan Patient appears to be physically stable. He will receive ECT today and is scheduled to continue on 3 times a week schedule. Patient agrees to plan.  Mack Thurmon 05/05/2015, 11:14 AM

## 2015-05-05 NOTE — Procedures (Signed)
ECT SERVICES Physician's Interval Evaluation & Treatment Note  Patient Identification: Jose Perez MRN:  [id number] Date of Evaluation:  05/05/2015 TX #: 1  MADRS: 33  MMSE: 30   P.E. Findings:  Physically patient is stable vital signs normal lungs and heart normal  Psychiatric Interval Note:  Mood is depressed nonpsychotic no active suicidal thoughts.  Subjective:  Patient is a 66 y.o. male seen for evaluation for Electroconvulsive Therapy. No specific new complaints outside the depression  Treatment Summary:     Right Unilateral              Bilateral   % Energy : 1.0 ms 35%   Impedance: 780 ohms  Seizure Energy Index: 6817 V squared  Postictal Suppression Index: 51%  Seizure Concordance Index: 98%  Medications  Pre Shock: Toradol 30 mg, Brevital 80 mg, succinylcholine 90 mg  Post Shock: None  Seizure Duration: 46 seconds by EMG, 73 seconds by EEG   Comments: Increased succinylcholine dose to 110 mg due to excess movement. Otherwise continue parameters. Follow-up Friday   Lungs:    Clear to auscultation                Other:   Heart:      Regular rhythm              irregular rhythm      Previous H&P reviewed, patient examined and there are NO CHANGES                   Previous H&P reviewed, patient examined and there are changes noted.   Mordecai Rasmussen, MD 1/25/201711:16 AM

## 2015-05-05 NOTE — Transfer of Care (Signed)
Immediate Anesthesia Transfer of Care Note  Patient: Jose Perez  Procedure(s) Performed: ECT  Patient Location: PACU  Anesthesia Type:General  Level of Consciousness: awake  Airway & Oxygen Therapy: Patient Spontanous Breathing and Patient connected to face mask oxygen  Post-op Assessment: Report given to RN  Post vital signs: Reviewed and stable  Last Vitals:  Filed Vitals:   05/05/15 0930 05/05/15 1136  BP: 138/93 151/89  Pulse: 79 25  Temp: 35.8 C 36.7 C  Resp: 16 24    Complications: No apparent anesthesia complications

## 2015-05-05 NOTE — Telephone Encounter (Signed)
Called for prior authorization of ECT, was told no authorization is required per Yazmin C. At [phone].

## 2015-05-05 NOTE — Anesthesia Preprocedure Evaluation (Signed)
Anesthesia Evaluation  Patient identified by MRN, date of birth, ID band Patient awake    Reviewed: Allergy & Precautions, H&P , NPO status , Patient's Chart, lab work & pertinent test results, reviewed documented beta blocker date and time   History of Anesthesia Complications Negative for: history of anesthetic complications  Airway Mallampati: III  TM Distance: >3 FB Neck ROM: full    Dental no notable dental hx. (+) Implants, Caps, Missing, Poor Dentition   Pulmonary neg shortness of breath, neg sleep apnea, neg COPD, neg recent URI, Current Smoker,    Pulmonary exam normal breath sounds clear to auscultation       Cardiovascular Exercise Tolerance: Good hypertension, On Medications (-) angina(-) CAD, (-) Past MI, (-) Cardiac Stents and (-) CABG Normal cardiovascular exam(-) dysrhythmias (-) Valvular Problems/Murmurs Rhythm:regular Rate:Normal     Neuro/Psych PSYCHIATRIC DISORDERS (Depression) negative neurological ROS     GI/Hepatic negative GI ROS, Neg liver ROS,   Endo/Other  negative endocrine ROS  Renal/GU negative Renal ROS  negative genitourinary   Musculoskeletal   Abdominal   Peds  Hematology negative hematology ROS (+)   Anesthesia Other Findings Past Medical History:   Cannabis dependence (HCC)                       03/13/2014    Depression                                                   Hypertension                                    05/19/14     Reproductive/Obstetrics negative OB ROS                             Anesthesia Physical Anesthesia Plan  ASA: II  Anesthesia Plan: General   Post-op Pain Management:    Induction:   Airway Management Planned:   Additional Equipment:   Intra-op Plan:   Post-operative Plan:   Informed Consent: I have reviewed the patients History and Physical, chart, labs and discussed the procedure including the risks, benefits and  alternatives for the proposed anesthesia with the patient or authorized representative who has indicated his/her understanding and acceptance.   Dental Advisory Given  Plan Discussed with: Anesthesiologist, CRNA and Surgeon  Anesthesia Plan Comments:         Anesthesia Quick Evaluation

## 2015-05-05 NOTE — Anesthesia Procedure Notes (Signed)
Date/Time: 05/05/2015 11:24 AM Performed by: Lily Kocher Pre-anesthesia Checklist: Patient identified, Emergency Drugs available, Suction available and Patient being monitored Patient Re-evaluated:Patient Re-evaluated prior to inductionOxygen Delivery Method: Circle system utilized Preoxygenation: Pre-oxygenation with 100% oxygen Intubation Type: IV induction Ventilation: Mask ventilation without difficulty and Mask ventilation throughout procedure Airway Equipment and Method: Bite block Placement Confirmation: positive ETCO2 Dental Injury: Teeth and Oropharynx as per pre-operative assessment

## 2015-05-06 NOTE — Anesthesia Postprocedure Evaluation (Signed)
Anesthesia Post Note  Patient: Jose Perez  Procedure(s) Performed: * No procedures listed *  Patient location during evaluation: PACU Anesthesia Type: General Level of consciousness: awake and alert Pain management: pain level controlled Vital Signs Assessment: post-procedure vital signs reviewed and stable Respiratory status: spontaneous breathing, nonlabored ventilation, respiratory function stable and patient connected to nasal cannula oxygen Cardiovascular status: blood pressure returned to baseline and stable Postop Assessment: no signs of nausea or vomiting Anesthetic complications: no    Last Vitals:  Filed Vitals:   05/05/15 1206 05/05/15 1213  BP: 141/88 129/84  Pulse: 89 85  Temp: 36.7 C   Resp: 21 18    Last Pain:  Filed Vitals:   05/05/15 1215  PainSc: Asleep                 Lenard Simmer

## 2015-05-07 ENCOUNTER — Encounter
Admission: RE | Admit: 2015-05-07 | Discharge: 2015-05-07 | Disposition: A | Discharge status: Home / Self Care | Payer: BLUE CROSS/BLUE SHIELD | Source: Ambulatory Visit | Attending: Psychiatry | Admitting: Psychiatry

## 2015-05-07 ENCOUNTER — Encounter: Payer: Self-pay | Admitting: Anesthesiology

## 2015-05-07 ENCOUNTER — Encounter: Payer: BLUE CROSS/BLUE SHIELD | Admitting: Anesthesiology

## 2015-05-07 DIAGNOSIS — F332 Major depressive disorder, recurrent severe without psychotic features: Secondary | ICD-10-CM | POA: Insufficient documentation

## 2015-05-07 DIAGNOSIS — F1220 Cannabis dependence, uncomplicated: Secondary | ICD-10-CM | POA: Diagnosis not present

## 2015-05-07 DIAGNOSIS — I10 Essential (primary) hypertension: Secondary | ICD-10-CM | POA: Insufficient documentation

## 2015-05-07 DIAGNOSIS — F17200 Nicotine dependence, unspecified, uncomplicated: Secondary | ICD-10-CM | POA: Insufficient documentation

## 2015-05-07 MED ORDER — SODIUM CHLORIDE 0.9 % IV SOLN
INTRAVENOUS | Status: DC | PRN
  Administered 2015-05-07: 11:00:00 via INTRAVENOUS

## 2015-05-07 MED ORDER — DEXTROSE 5 % IV SOLN: 250.0000 mL | Freq: Once | INTRAVENOUS | Status: DC

## 2015-05-07 MED ORDER — MIDAZOLAM HCL 2 MG/2ML IJ SOLN
INTRAMUSCULAR | Status: DC | PRN
  Administered 2015-05-07: 1 mg via INTRAVENOUS

## 2015-05-07 MED ORDER — METHOHEXITAL SODIUM 100 MG/10ML IV SOSY
PREFILLED_SYRINGE | INTRAVENOUS | Status: DC | PRN
  Administered 2015-05-07: 80 mg via INTRAVENOUS

## 2015-05-07 MED ORDER — SUCCINYLCHOLINE 20MG/ML (10ML) SYRINGE FOR MEDFUSION PUMP - OPTIME
INTRAMUSCULAR | Status: DC | PRN
  Administered 2015-05-07: 110 mg via INTRAVENOUS

## 2015-05-07 MED ORDER — SODIUM CHLORIDE 0.9 % IV SOLN
Freq: Once | INTRAVENOUS | Status: AC
  Administered 2015-05-07: 11:00:00 via INTRAVENOUS

## 2015-05-07 MED ORDER — KETOROLAC TROMETHAMINE 30 MG/ML IJ SOLN
30.0000 mg | Freq: Once | INTRAMUSCULAR | Status: AC
  Administered 2015-05-07: 30 mg via INTRAVENOUS

## 2015-05-07 NOTE — Anesthesia Procedure Notes (Signed)
Date/Time: 05/07/2015 11:28 AM Performed by: Lily Kocher Pre-anesthesia Checklist: Patient identified, Emergency Drugs available, Suction available and Patient being monitored Patient Re-evaluated:Patient Re-evaluated prior to inductionOxygen Delivery Method: Circle system utilized Preoxygenation: Pre-oxygenation with 100% oxygen Intubation Type: IV induction Ventilation: Mask ventilation without difficulty and Mask ventilation throughout procedure Airway Equipment and Method: Bite block Placement Confirmation: positive ETCO2 Dental Injury: Teeth and Oropharynx as per pre-operative assessment

## 2015-05-07 NOTE — Anesthesia Preprocedure Evaluation (Addendum)
Anesthesia Evaluation  Patient identified by MRN, date of birth, ID band Patient awake    Reviewed: Allergy & Precautions, H&P , NPO status , Patient's Chart, lab work & pertinent test results, reviewed documented beta blocker date and time   History of Anesthesia Complications Negative for: history of anesthetic complications  Airway Mallampati: III  TM Distance: >3 FB Neck ROM: full    Dental no notable dental hx. (+) Implants, Caps, Missing, Poor Dentition   Pulmonary neg shortness of breath, neg sleep apnea, neg COPD, neg recent URI, Current Smoker,    Pulmonary exam normal breath sounds clear to auscultation       Cardiovascular Exercise Tolerance: Good hypertension, On Medications (-) angina(-) CAD, (-) Past MI, (-) Cardiac Stents and (-) CABG Normal cardiovascular exam(-) dysrhythmias (-) Valvular Problems/Murmurs Rhythm:regular Rate:Normal     Neuro/Psych PSYCHIATRIC DISORDERS (Depression) negative neurological ROS     GI/Hepatic negative GI ROS, Neg liver ROS, neg GERD  ,  Endo/Other  negative endocrine ROS  Renal/GU negative Renal ROS  negative genitourinary   Musculoskeletal   Abdominal   Peds  Hematology negative hematology ROS (+)   Anesthesia Other Findings Past Medical History:   Cannabis dependence (HCC)                       03/13/2014    Depression                                                   Hypertension                                    05/19/14    BMI    Body Mass Index   25.99 kg/m 2      Reproductive/Obstetrics negative OB ROS                            Anesthesia Physical  Anesthesia Plan  ASA: III  Anesthesia Plan: General   Post-op Pain Management:    Induction:   Airway Management Planned:   Additional Equipment:   Intra-op Plan:   Post-operative Plan:   Informed Consent: I have reviewed the patients History and Physical, chart, labs  and discussed the procedure including the risks, benefits and alternatives for the proposed anesthesia with the patient or authorized representative who has indicated his/her understanding and acceptance.   Dental Advisory Given  Plan Discussed with: Anesthesiologist, CRNA and Surgeon  Anesthesia Plan Comments:         Anesthesia Quick Evaluation

## 2015-05-07 NOTE — Anesthesia Postprocedure Evaluation (Signed)
Anesthesia Post Note  Patient: Jose Perez  Procedure(s) Performed: * No procedures listed *  Patient location during evaluation: PACU Anesthesia Type: General Level of consciousness: awake and alert Pain management: pain level controlled Vital Signs Assessment: post-procedure vital signs reviewed and stable Respiratory status: spontaneous breathing, nonlabored ventilation, respiratory function stable and patient connected to nasal cannula oxygen Cardiovascular status: blood pressure returned to baseline and stable Postop Assessment: no signs of nausea or vomiting Anesthetic complications: no    Last Vitals:  Filed Vitals:   05/07/15 1155 05/07/15 1200  BP: 107/67 103/73  Pulse: 80 87  Temp:    Resp: 23 22    Last Pain:  Filed Vitals:   05/07/15 1201  PainSc: 0-No pain                 Cleda Mccreedy Omar Gayden

## 2015-05-07 NOTE — Transfer of Care (Signed)
Immediate Anesthesia Transfer of Care Note  Patient: Jose Perez  Procedure(s) Performed: ECT  Patient Location: PACU  Anesthesia Type:General  Level of Consciousness: sedated  Airway & Oxygen Therapy: Patient Spontanous Breathing and Patient connected to face mask oxygen  Post-op Assessment: Report given to RN  Post vital signs: Reviewed and stable  Last Vitals:  Filed Vitals:   05/07/15 0909 05/07/15 1144  BP: 134/79 91/61  Pulse: 66 69  Temp:  36.2 C  Resp: 18 19    Complications: No apparent anesthesia complications

## 2015-05-07 NOTE — H&P (Signed)
Jose Perez is an 65 y.o. male.   Chief Complaint: Patient with major depression rule out bipolar disorder being treated with ECT for depressive symptoms. Minimal change since last time. Possible minor degrees of memory problem. HPI: Patient with major depression possible bipolar being treated with ECT for depressive symptoms  Past Medical History  Diagnosis Date  . Cannabis dependence (HCC) 03/13/2014  . Depression   . Hypertension 05/19/14    Past Surgical History  Procedure Laterality Date  . No past surgeries      Family History  Problem Relation Age of Onset  . Depression Sister   . Schizophrenia Sister   . Heart attack Father    Social History:  reports that he has been smoking Cigarettes.  He has been smoking about 0.50 packs per day. He has never used smokeless tobacco. He reports that he uses illicit drugs (Marijuana). He reports that he does not drink alcohol.  Allergies: No Known Allergies   (Not in a hospital admission)  No results found for this or any previous visit (from the past 48 hour(s)). No results found.  Review of Systems  Constitutional: Negative.   HENT: Negative.   Eyes: Negative.   Respiratory: Negative.   Cardiovascular: Negative.   Gastrointestinal: Negative.   Musculoskeletal: Negative.   Skin: Negative.   Neurological: Negative.   Psychiatric/Behavioral: Positive for depression and memory loss. Negative for suicidal ideas, hallucinations and substance abuse. The patient is not nervous/anxious and does not have insomnia.     Blood pressure 134/79, pulse 66, resp. rate 18, height  (1.702 m), weight 75.297 kg (166 lb), SpO2 100 %. Physical Exam  Nursing note and vitals reviewed. Constitutional: He appears well-developed and well-nourished.  HENT:  Head: Normocephalic and atraumatic.  Eyes: Conjunctivae are normal. Pupils are equal, round, and reactive to light.  Neck: Normal range of motion.  Cardiovascular: Normal rate, regular  rhythm and normal heart sounds.   Respiratory: Effort normal and breath sounds normal. No respiratory distress.  GI: Soft.  Musculoskeletal: Normal range of motion.  Neurological: He is alert.  Skin: Skin is warm and dry.  Psychiatric: He has a normal mood and affect. His behavior is normal. Judgment and thought content normal.     Assessment/Plan Treatment today. Anticipate continuing 3 times a week into next week patient agrees to plan no other change to medicine anticipated at this time.  [name] 05/07/2015, 11:19 AM

## 2015-05-07 NOTE — Procedures (Addendum)
ECT SERVICES Physician's Interval Evaluation & Treatment Note  Patient Identification: GAEL LONDO MRN:  [id number] Date of Evaluation:  05/07/2015 TX #: 2  MADRS:   MMSE:   P.E. Findings:  No change to physical exam. Vitals stable. Lungs and heart clear.  Psychiatric Interval Note:  Continues with depression no clear change. A little bit of memory impairment not severe  Subjective:  Patient is a 65 y.o. male seen for evaluation for Electroconvulsive Therapy. No specific new complaints  Treatment Summary:     Right Unilateral              Bilateral   % Energy : 1.0 ms 35%   Impedance: 900 ohms  Seizure Energy Index: 915 V squared  Postictal Suppression Index: 26%  Seizure Concordance Index: 93%  Medications  Pre Shock: Toradol 30 mg Brevital 80 mg succinylcholine 110 mg  Post Shock: Versed 1 mg  Seizure Duration: EMG not detected, 45 seconds by EEG   Comments: Length adequate but energy and postictal suppression in adequate. I am going to increase his energy to 65% for next treatment. We will plan on having 1 mg of Versed after treatment. He was a slightly agitated possibly from the succinylcholine  Lungs:    Clear to auscultation                Other:   Heart:      Regular rhythm              irregular rhythm      Previous H&P reviewed, patient examined and there are NO CHANGES                   Previous H&P reviewed, patient examined and there are changes noted.   Mordecai Rasmussen, MD 1/27/201711:21 AM

## 2015-05-10 ENCOUNTER — Encounter
Admission: RE | Admit: 2015-05-10 | Discharge: 2015-05-10 | Disposition: A | Discharge status: Home / Self Care | Payer: BLUE CROSS/BLUE SHIELD | Source: Ambulatory Visit | Attending: Psychiatry | Admitting: Psychiatry

## 2015-05-10 ENCOUNTER — Encounter: Payer: BLUE CROSS/BLUE SHIELD | Admitting: Anesthesiology

## 2015-05-10 ENCOUNTER — Encounter: Payer: Self-pay | Admitting: Anesthesiology

## 2015-05-10 DIAGNOSIS — F1220 Cannabis dependence, uncomplicated: Secondary | ICD-10-CM | POA: Diagnosis not present

## 2015-05-10 DIAGNOSIS — F332 Major depressive disorder, recurrent severe without psychotic features: Secondary | ICD-10-CM | POA: Diagnosis not present

## 2015-05-10 DIAGNOSIS — F17200 Nicotine dependence, unspecified, uncomplicated: Secondary | ICD-10-CM | POA: Insufficient documentation

## 2015-05-10 DIAGNOSIS — I10 Essential (primary) hypertension: Secondary | ICD-10-CM | POA: Insufficient documentation

## 2015-05-10 MED ORDER — SUCCINYLCHOLINE CHLORIDE 20 MG/ML IJ SOLN
INTRAMUSCULAR | Status: DC | PRN
  Administered 2015-05-10: 110 mg via INTRAVENOUS

## 2015-05-10 MED ORDER — METHOHEXITAL SODIUM 100 MG/10ML IV SOSY
PREFILLED_SYRINGE | INTRAVENOUS | Status: DC | PRN
  Administered 2015-05-10: 80 mg via INTRAVENOUS

## 2015-05-10 MED ORDER — MIDAZOLAM HCL 2 MG/2ML IJ SOLN: 1.0000 mg | Freq: Once | INTRAMUSCULAR | Status: DC

## 2015-05-10 MED ORDER — SODIUM CHLORIDE 0.9 % IV SOLN
250.0000 mL | Freq: Once | INTRAVENOUS | Status: AC
  Administered 2015-05-10: 500 mL via INTRAVENOUS

## 2015-05-10 MED ORDER — KETOROLAC TROMETHAMINE 30 MG/ML IJ SOLN
30.0000 mg | Freq: Once | INTRAMUSCULAR | Status: AC
  Administered 2015-05-10: 30 mg via INTRAVENOUS

## 2015-05-10 NOTE — Transfer of Care (Signed)
Immediate Anesthesia Transfer of Care Note  Patient: Jose Perez Imm  Procedure(s) Performed: * No procedures listed * ECT  Patient Location: PACU  Anesthesia Type:General  Level of Consciousness: sedated  Airway & Oxygen Therapy: Patient Spontanous Breathing and Patient connected to face mask oxygen  Post-op Assessment: Report given to RN and Post -op Vital signs reviewed and stable  Post vital signs: Reviewed and stable  Last Vitals:  Filed Vitals:   05/10/15 0920  BP: 129/73  Pulse: 72  Temp: 35.6 C  Resp: 18    Complications: No apparent anesthesia complications

## 2015-05-10 NOTE — Anesthesia Preprocedure Evaluation (Signed)
Anesthesia Evaluation  Patient identified by MRN, date of birth, ID band Patient awake    Reviewed: Allergy & Precautions, H&P , NPO status , Patient's Chart, lab work & pertinent test results, reviewed documented beta blocker date and time   History of Anesthesia Complications Negative for: history of anesthetic complications  Airway Mallampati: III  TM Distance: >3 FB Neck ROM: full    Dental no notable dental hx. (+) Implants, Caps, Missing, Poor Dentition   Pulmonary neg shortness of breath, neg sleep apnea, neg COPD, neg recent URI, Current Smoker,    Pulmonary exam normal breath sounds clear to auscultation       Cardiovascular Exercise Tolerance: Good hypertension, On Medications (-) angina(-) CAD, (-) Past MI, (-) Cardiac Stents and (-) CABG Normal cardiovascular exam(-) dysrhythmias (-) Valvular Problems/Murmurs Rhythm:regular Rate:Normal     Neuro/Psych PSYCHIATRIC DISORDERS (Depression) Depression negative neurological ROS     GI/Hepatic negative GI ROS, Neg liver ROS, neg GERD  ,  Endo/Other  negative endocrine ROS  Renal/GU negative Renal ROS  negative genitourinary   Musculoskeletal   Abdominal   Peds  Hematology negative hematology ROS (+)   Anesthesia Other Findings Past Medical History:   Cannabis dependence (HCC)                       03/13/2014    Depression                                                   Hypertension                                    05/19/14    BMI    Body Mass Index   25.99 kg/m 2      Reproductive/Obstetrics negative OB ROS                             Anesthesia Physical  Anesthesia Plan  ASA: III  Anesthesia Plan: General   Post-op Pain Management:    Induction:   Airway Management Planned:   Additional Equipment:   Intra-op Plan:   Post-operative Plan:   Informed Consent: I have reviewed the patients History and Physical,  chart, labs and discussed the procedure including the risks, benefits and alternatives for the proposed anesthesia with the patient or authorized representative who has indicated his/her understanding and acceptance.   Dental Advisory Given  Plan Discussed with: Anesthesiologist, CRNA and Surgeon  Anesthesia Plan Comments:         Anesthesia Quick Evaluation

## 2015-05-10 NOTE — Procedures (Signed)
ECT SERVICES Physician's Interval Evaluation & Treatment Note  Patient Identification: Jose Perez MRN:  [id number] Date of Evaluation:  05/10/2015 TX #: 3  MADRS:   MMSE:   P.E. Findings:  No change to physical exam  Psychiatric Interval Note:  Continues to report depression with withdrawn affect but is alert and oriented  Subjective:  Patient is a 65 y.o. male seen for evaluation for Electroconvulsive Therapy. Concerned about significant memory loss and confusion over the weekend  Treatment Summary:     Right Unilateral              Bilateral   % Energy : 0.3 ms 75%   Impedance: 1080 ohms  Seizure Energy Index: 2549 V squared  Postictal Suppression Index: 42%  Seizure Concordance Index: 99%  Medications  Pre Shock: Toradol 30 mg, Brevital 80 mg, succinylcholine 110 mg  Post Shock: Versed 1 mg  Seizure Duration: 35 seconds by EMG, 72 seconds by EEG   Comments: Follow-up Wednesday and Friday presumably to continue right unilateral.   Lungs:    Clear to auscultation                Other:   Heart:      Regular rhythm              irregular rhythm      Previous H&P reviewed, patient examined and there are NO CHANGES                   Previous H&P reviewed, patient examined and there are changes noted.   Jose Rasmussen, MD 1/30/201712:00 PM

## 2015-05-10 NOTE — H&P (Signed)
Jose Perez is an 65 y.o. male.   Chief Complaint: Patient receiving ECT treatment for severe depression. After his treatment on Friday he had a significant amount of confusion and memory impairment over the weekend that was disturbing. No significant change noted in mood HPI: Patient with recurrent severe depression referred for ECT treatment  Past Medical History  Diagnosis Date  . Cannabis dependence (HCC) 03/13/2014  . Depression   . Hypertension 05/19/14    Past Surgical History  Procedure Laterality Date  . No past surgeries      Family History  Problem Relation Age of Onset  . Depression Sister   . Schizophrenia Sister   . Heart attack Father    Social History:  reports that he has been smoking Cigarettes.  He has been smoking about 0.50 packs per day. He has never used smokeless tobacco. He reports that he uses illicit drugs (Marijuana). He reports that he does not drink alcohol.  Allergies: No Known Allergies   (Not in a hospital admission)  No results found for this or any previous visit (from the past 48 hour(s)). No results found.  Review of Systems  Constitutional: Negative.   HENT: Negative.   Eyes: Negative.   Respiratory: Negative.   Cardiovascular: Negative.   Gastrointestinal: Negative.   Musculoskeletal: Negative.   Skin: Negative.   Neurological: Negative.   Psychiatric/Behavioral: Positive for depression and memory loss. Negative for suicidal ideas, hallucinations and substance abuse. The patient has insomnia. The patient is not nervous/anxious.     Blood pressure 129/73, pulse 72, temperature 96.1 F (35.6 C), temperature source Oral, resp. rate 18, height  (1.702 m), weight 75.751 kg (167 lb), SpO2 98 %. Physical Exam  Nursing note and vitals reviewed. Constitutional: He appears well-developed and well-nourished.  HENT:  Head: Normocephalic and atraumatic.  Eyes: Conjunctivae are normal. Pupils are equal, round, and reactive to light.   Neck: Normal range of motion.  Cardiovascular: Normal rate, regular rhythm and normal heart sounds.   Respiratory: Effort normal and breath sounds normal. No respiratory distress.  GI: Soft.  Musculoskeletal: Normal range of motion.  Neurological: He is alert.  Skin: Skin is warm and dry.  Psychiatric: Judgment and thought content normal. His speech is delayed. He is slowed. He exhibits a depressed mood. He exhibits abnormal recent memory.     Assessment/Plan After discussion of side effects we have elected to try switching to right unilateral treatment today to see if that is more tolerable. We will follow-up as scheduled on Wednesday and Friday presumably but continue monitoring cognitive side effects  Angelmarie Ponzo 05/10/2015, 11:58 AM

## 2015-05-11 ENCOUNTER — Ambulatory Visit (INDEPENDENT_AMBULATORY_CARE_PROVIDER_SITE_OTHER): Payer: Medicare Other | Admitting: Psychiatry

## 2015-05-11 ENCOUNTER — Encounter (HOSPITAL_COMMUNITY): Payer: Self-pay | Admitting: Psychiatry

## 2015-05-11 VITALS — BP 160/80 | HR 84 | Ht 67.0 in | Wt 169.2 lb

## 2015-05-11 DIAGNOSIS — F332 Major depressive disorder, recurrent severe without psychotic features: Secondary | ICD-10-CM | POA: Diagnosis not present

## 2015-05-11 NOTE — Anesthesia Postprocedure Evaluation (Signed)
Anesthesia Post Note  Patient: Jose Perez  Procedure(s) Performed: * No procedures listed *  Patient location during evaluation: PACU Anesthesia Type: General Level of consciousness: awake and alert and oriented Pain management: pain level controlled Vital Signs Assessment: post-procedure vital signs reviewed and stable Respiratory status: spontaneous breathing Cardiovascular status: blood pressure returned to baseline Anesthetic complications: no    Last Vitals:  Filed Vitals:   05/10/15 1248 05/10/15 1252  BP: 115/87 119/76  Pulse:  82  Temp:    Resp: 26 18    Last Pain:  Filed Vitals:   05/10/15 1256  PainSc: 0-No pain                 [name]

## 2015-05-11 NOTE — Progress Notes (Signed)
[facility] MD Progress Note  05/11/2015 7:49 PM Jose Perez  MRN:  [id number] Subjective:  Patient reports some improvement in mood, although still feeling depressed. States he has undergone three outpatient ECTs up to now, and expresses concern that ECT is causing some memory difficulties . Objective : Patient currently undergoing outpatient ECT for depression. He has had three thus far, last one yesterday. As per his report he has had two bilateral ones and one unilateral ECT . He states his mood remains depressed, but he does feel he has improved partially. He is concerned about a subjective sense of short term memory loss, and wife , who normally comes to appointments with him, corroborates that he seems somewhat forgetful. Of note, recall was 3/3 immediate, 1/3 at 5 minutes, 2/3 at 5 minutes with cues, able to perform serial subtractions, able to spell  5 letter word backwards with no difficulty, able to name office objects with no difficulties, and general fund of knowledge questions within normal. Patient continues to take Effexor XR and Buspar and is tolerating these medications well. Patient has upcoming court date for Cannabis possession- he expresses some anxiety related to this, but mainly  A sense of guilt about how his drug use has adversely affected family. At this time has been sober from cannabis for several weeks.  Of note, patient is taking time off work and does not plan to return to work until sometime in March, but states he is seriously debating retiring, although he is ambivalent and wife concerned about financial and daily structure implications of this decision. Denies any suicidal ideations, no psychotic symptoms. Of note, today presenting with much improved insight regarding cannabis dependence and negative impact on his level of functioning and on his family dynamics- states " I see how it affected me and them, I guess I did not see it as clearly then. I have apologized to my  daughter and my wife but I still feel guilty". We discussed / processed- patient states " I am ready to surrender, I am ready to start going to NA meetings ".  Principal Problem:  Major Depression, Recurrent, Cannabis Dependence in early remission Diagnosis:   Patient Active Problem List   Diagnosis Date Noted  . 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 Major depression (HCC) [F32.9] 03/13/2014  . Cannabis dependence (HCC) [F12.20] 03/13/2014   Total Time spent with patient: 30 minutes    Past Medical History:  Past Medical History  Diagnosis Date  . Cannabis dependence (HCC) 03/13/2014  . Depression   . Hypertension 05/19/14    Past Surgical History  Procedure Laterality Date  . No past surgeries     Family History:  Family History  Problem Relation Age of Onset  . Depression Sister   . Schizophrenia Sister   . Heart attack Father     Social History:  History  Alcohol Use No     History  Drug Use  . Yes  . Special: Marijuana    Comment: last used about a month and half ago    Social History   Social History  . Marital Status: Married    Spouse Name: N/A  . Number of Children: N/A  . Years of Education: N/A   Social History Main Topics  . Smoking status: Current Every Day Smoker -- 0.50 packs/day    Types: Cigarettes  . Smokeless tobacco: Never Used  . Alcohol Use: No  . Drug Use: Yes  Special: Marijuana     Comment: last used about a month and half ago  . Sexual Activity: Not Currently   Other Topics Concern  . Not on file   Social History Narrative   Additional Social History:   Sleep: Good  Appetite:  Fair  Current Medications: Current Outpatient Prescriptions  Medication Sig Dispense Refill  . benazepril (LOTENSIN) 10 MG tablet Take 10 mg by mouth daily.    . busPIRone (BUSPAR) 10 MG tablet Take 1 tablet (10 mg total) by mouth 2 (two) times daily. 60 tablet 1  . venlafaxine XR (EFFEXOR XR) 75 MG 24  hr capsule Take 3 capsules (225 mg total) by mouth daily with breakfast. 90 capsule 1   No current facility-administered medications for this visit.    Lab Results: No results found for this or any previous visit (from the past 48 hour(s)).  Physical Findings: AIMS:  , ,  ,  ,    CIWA:    COWS:     Musculoskeletal: Strength & Muscle Tone: within normal limits Gait & Station: normal Patient leans: N/A  Psychiatric Specialty Exam: ROS  Blood pressure 160/80, pulse 84, height  (1.702 m), weight 169 lb 3.2 oz (76.749 kg).Body mass index is 26.49 kg/(m^2).  General Appearance: Well Groomed  Patent attorney::  Good  Speech:  Normal Rate  Volume:  Normal  Mood:  depressed, but improved compared to admission  Affect:  Appropriate and slightly constricted   Thought Process:  Linear  Orientation:  Full (Time, Place, and Person)  Thought Content:  denies hallucinations, no delusions expressed   Suicidal Thoughts:  No- today denies any suicidal ideations, denies any self injurious ideations  Homicidal Thoughts:  No  Memory:  recall 3/3 immediate, 1/3 at 5 minutes without cues   Judgement:  Other:  improved   Insight:  Present  Psychomotor Activity:  Normal  Concentration:  Good  Recall:  Good  Fund of Knowledge:Good  Language: Good  Akathisia:  Negative  Handed:  Right  AIMS (if indicated):     Assets:  Desire for Improvement Resilience Social Support  ADL's:  Intact  Cognition: WNL  Sleep:     Assessment - patient  Remains depressed, but has improved partially with ECT , has had # 3 thus far. However, he reports subjective sense of decreased short term memory and recall was 3/3 immediate but 1/3 at 5 minutes . He is 0x 3 , and no evidence of confusion or delirium. He denies SI .  Of note, he is gaining insight into cannabis dependence and negative impact on his level of functioning and on his family. He is now sober for several weeks. He expresses an increased readiness to work  on recovery, go to NA. Tolerating Effexor XR and Buspar well .  Treatment Plan Summary: Will see patient in three weeks- agrees to contact me sooner if any worsening prior. Plans to continue EFFEXOR XR 225 mgrs QDAY and BUSPAR 10 mgrs BID Ambivalent on whether to continue outpatient ECT due to concerns about possible cognitive side effects- plans to discuss with Dr. Patterson Hammersmith, and states " I may stop ECT or go down to once a week if he feels that would still help me ". Patient not driving at this time- I have reviewed importance of abstaining from driving or other potential dangerous activities if he is feeling confused or forgetful. As noted, patient currently taking time off work and debating retiring . Strongly encouraged patient to  go to 12 step meetings , which he stated he feels ready and motivated to do at this time  [place], [facility] 05/11/2015, 7:49 PM

## 2015-05-12 ENCOUNTER — Inpatient Hospital Stay: Admission: RE | Admit: 2015-05-12 | Payer: Self-pay | Source: Ambulatory Visit

## 2015-05-12 ENCOUNTER — Telehealth: Payer: Self-pay | Admitting: *Deleted

## 2015-05-12 ENCOUNTER — Encounter: Payer: Self-pay | Admitting: Anesthesiology

## 2015-05-25 ENCOUNTER — Ambulatory Visit (HOSPITAL_COMMUNITY): Payer: Self-pay | Admitting: Psychiatry

## 2015-06-01 ENCOUNTER — Ambulatory Visit (INDEPENDENT_AMBULATORY_CARE_PROVIDER_SITE_OTHER): Payer: Medicare Other | Admitting: Psychiatry

## 2015-06-01 VITALS — BP 148/83 | HR 92 | Ht 67.0 in | Wt 167.4 lb

## 2015-06-01 DIAGNOSIS — F332 Major depressive disorder, recurrent severe without psychotic features: Secondary | ICD-10-CM | POA: Diagnosis not present

## 2015-06-01 MED ORDER — VENLAFAXINE HCL ER 75 MG PO CP24
225.0000 mg | ORAL_CAPSULE | Freq: Every day | ORAL | Status: DC
Start: 2015-06-01 — End: 2015-10-28

## 2015-06-01 MED ORDER — BUSPIRONE HCL 15 MG PO TABS: 15.0000 mg | ORAL_TABLET | Freq: Two times a day (BID) | ORAL | Status: DC

## 2015-06-01 MED ORDER — MIRTAZAPINE 15 MG PO TABS: 15.0000 mg | ORAL_TABLET | Freq: Every day | ORAL | Status: DC

## 2015-06-02 ENCOUNTER — Encounter (HOSPITAL_COMMUNITY): Payer: Self-pay | Admitting: Psychiatry

## 2015-06-02 NOTE — Progress Notes (Signed)
[facility] MD Progress Note  06/02/2015 6:18 PM [address]  MRN:  [id number] Subjective:   Patient reports ongoing  Subjective sense of depression, and describes symptoms such as low energy, poor motivation, some anhedonia. Objective : Patient comes in with wife, who provides collateral information. At this time patient reports, and wife corroborates, that he has remained sober, abstinent from cannabis x about two months- he appears more motivated in sobriety , abstinence than he had been in the past, and has gone to a few 12 step meetings. Him and his wife are going to [place] soon to work on his late [employer] estate and states he appreciates his wife being there with him for support and because he knows if she is there with him he will not relapse. Denies alcohol or other drug abuse . Patient states he had hearing for possession charge and " it went OK"- he has completed some community service and states charges may be dismissed  As above, reports ongoing depressive symptoms, with some neuro-vegetative symptoms. However, he does state that over the last couple of days he has started to feel " just a little bit better", and has been a little more active . He states he has had passive suicidal ideations, but denies any active SI and states his sense of responsibility to family, his love for his children, and his personal beliefs are protective factors from suicide. He states that since his ECT course, which he stopped prematurely after three ECTs due to concern of cognitive side effects, he has continued to feel his memory is " not the same", and his thinking is somewhat slower. Of note, there is no gross cognitive decline and a MMSE done today was entirely normal at 30/30. Patient partially reassured by this . Patient has been on Effexor XR  For many years and states it has been very effective , but does want to add a medication to help address depression further- we discussed options and he agrees to adding  REMERON. We reviewed side effects and rationale. Ambivalent about whether to return to work later next month or go ahead and retire. Wife encouraging him to return to work.   Principal Problem:  Major Depression, Recurrent, and Social Phobia.  Diagnosis:   Patient Active Problem List   Diagnosis Date Noted  . 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 Major depression (HCC) [F32.9] 03/13/2014  . Cannabis dependence (HCC) [F12.20] 03/13/2014   Total Time spent with patient: 30 minutes    Past Medical History:  Past Medical History  Diagnosis Date  . Cannabis dependence (HCC) 03/13/2014  . Depression   . Hypertension 05/19/14    Past Surgical History  Procedure Laterality Date  . No past surgeries     Family History:  Family History  Problem Relation Age of Onset  . Depression Sister   . Schizophrenia Sister   . Heart attack Father    Social History:  History  Alcohol Use No     History  Drug Use  . Yes  . Special: Marijuana    Comment: last used about a month and half ago    Social History   Social History  . Marital Status: Married    Spouse Name: N/A  . Number of Children: N/A  . Years of Education: N/A   Social History Main Topics  . Smoking status: Current Every Day Smoker -- 0.50 packs/day    Types: Cigarettes  . Smokeless tobacco:  Never Used  . Alcohol Use: No  . Drug Use: Yes    Special: Marijuana     Comment: last used about a month and half ago  . Sexual Activity: Not Currently   Other Topics Concern  . Not on file   Social History Narrative   Additional Social History:   Sleep: Fair  Appetite:  Fair- has lost a few pounds  Current Medications: Current Outpatient Prescriptions  Medication Sig Dispense Refill  . benazepril (LOTENSIN) 10 MG tablet Take 10 mg by mouth daily.    . busPIRone (BUSPAR) 15 MG tablet Take 1 tablet (15 mg total) by mouth 2 (two) times daily. 60 tablet 1  . mirtazapine  (REMERON) 15 MG tablet Take 1 tablet (15 mg total) by mouth at bedtime. 30 tablet 1  . venlafaxine XR (EFFEXOR XR) 75 MG 24 hr capsule Take 3 capsules (225 mg total) by mouth daily with breakfast. 90 capsule 1   No current facility-administered medications for this visit.    Lab Results: No results found for this or any previous visit (from the past 48 hour(s)).  Blood Alcohol level:  No results found for: [facility]  Physical Findings: AIMS:  , ,  ,  ,    CIWA:    COWS:     Musculoskeletal: Strength & Muscle Tone: within normal limits Gait & Station: normal Patient leans: N/A  Psychiatric Specialty Exam: ROS no chest pain, no shortness of breath, no  Rash   Blood pressure 148/83, pulse 92, height  (1.702 m), weight 167 lb 6.4 oz (75.932 kg).Body mass index is 26.21 kg/(m^2).  General Appearance: Neat  Eye Contact::  Good  Speech:  Normal Rate  Volume:  Normal  Mood:  depressed   Affect:  constricted but fully reactive affect   Thought Process:  Intact  Orientation:  Full (Time, Place, and Person)  Thought Content:  denies hallucinations, no delusions   Suicidal Thoughts:  No vague passive SI, but denies any plan or intention of hurting self or of SI   Homicidal Thoughts:  No  Memory:  recent and remote grossly intact   Judgement:  Other:  improved   Insight:  Present  Psychomotor Activity:  Normal  Concentration:  Good  Recall:  Good  Fund of Knowledge:Good  Language: Good  Akathisia:  Negative  Handed:  Right  AIMS (if indicated):     Assets:  Communication Skills Desire for Improvement Financial Resources/Insurance Housing Social Support  ADL's:  Intact  Cognition: WNL  Sleep:     Assessment - remains depressed, but reports some improvement over recent days, and affect, although constricted, is reactive. Does endorse ongoing neuro-vegetative symptoms of depression. Denies suicidal plan or intention and identifies family as protective factor from suicide. Sober  and abstinent from cannabis and motivated in sobriety at this time.  No evidence of cognitive decline - states that after ECT he feels he is slower cognitively, is fully alert, attentive, and MMSE 30/30, answered all questions without any particular difficulties . Treatment Plan Summary: Daily contact with patient to assess and evaluate symptoms and progress in treatment, Medication management, Plan will see in three weeks or sooner if needed- agrees to contact me if  any worsening prior and medications as below  Continue EFFEXOR 225 mgrs QDAY  Continue BUSPAR at 15 mgrs BID Start REMERON 15 mgrs QHS for depression- side effects , including sedation and weight gain potential, reviewed  Nehemiah Massed, MD 06/02/2015, 6:18 PM

## 2015-06-15 ENCOUNTER — Ambulatory Visit (INDEPENDENT_AMBULATORY_CARE_PROVIDER_SITE_OTHER): Payer: Medicare Other | Admitting: Psychiatry

## 2015-06-15 ENCOUNTER — Encounter (HOSPITAL_COMMUNITY): Payer: Self-pay | Admitting: Psychiatry

## 2015-06-15 VITALS — BP 120/80 | HR 98 | Ht 67.0 in | Wt 168.6 lb

## 2015-06-15 DIAGNOSIS — F332 Major depressive disorder, recurrent severe without psychotic features: Secondary | ICD-10-CM | POA: Diagnosis not present

## 2015-06-15 MED ORDER — BUSPIRONE HCL 15 MG PO TABS: 15.0000 mg | ORAL_TABLET | Freq: Two times a day (BID) | ORAL | Status: DC

## 2015-06-15 MED ORDER — VENLAFAXINE HCL ER 75 MG PO CP24: 225.0000 mg | ORAL_CAPSULE | Freq: Every day | ORAL | Status: DC

## 2015-06-15 MED ORDER — MIRTAZAPINE 30 MG PO TABS: 30.0000 mg | ORAL_TABLET | Freq: Every day | ORAL | Status: DC

## 2015-06-15 NOTE — Progress Notes (Signed)
[facility] MD Progress Note  06/15/2015 7:55 PM Jose Perez  MRN:  [id number] Subjective:  Continues to feel depressed, but does feel he is gradually improving , with less severe anhedonia, less intense ruminations, more energy. Wife corroborates that he is improving , and that he is more verbal and more active than he had been. They recently returned from [place] , where they went to help settle his late mother's estate. They state that trip went well , and patient was able to do what he needed to . Patient denies any relapse on cannabis, and wife corroborates that she does not think he has been using this substance recently. Denies medication side effects. Objective : Patient presents partially improved, and although still depressed, presents with improved range of affect, improved verbal output, and an overall improved , more relaxed demeanor . States he does continue to feel sad, but has had no ongoing suicidal ideations and denies any self injurious or suicidal thoughts recently. He had reported subjective sense of being cognitively slowed and forgetful, but has scored 30/30 on MMSE and today is fully alert and attentive, 0x 3.  Anxious about returning to work after a period of several weeks of absence from work, debating whether to return at all or just retire .  At this time wife and patient are going on a vacation to [place] x one week- patient still anhedonic, but states he is actually looking forward to trip. No medication side effects- currently on Effexor XR, Buspar, and Remeron Principal Problem:  Major Depression, Recurrent, Severe , no psychotic symptoms  Diagnosis:   Patient Active Problem List   Diagnosis Date Noted  . 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 Major depression (HCC) [F32.9] 03/13/2014  . Cannabis dependence (HCC) [F12.20] 03/13/2014   Total Time spent with patient: 30 minutes    Past Medical History:  Past Medical History   Diagnosis Date  . Cannabis dependence (HCC) 03/13/2014  . Depression   . Hypertension 05/19/14    Past Surgical History  Procedure Laterality Date  . No past surgeries     Family History:  Family History  Problem Relation Age of Onset  . Depression Sister   . Schizophrenia Sister   . Heart attack Father     Social History:  History  Alcohol Use No     History  Drug Use  . Yes  . Special: Marijuana    Comment: last used about a month and half ago    Social History   Social History  . Marital Status: Married    Spouse Name: N/A  . Number of Children: N/A  . Years of Education: N/A   Social History Main Topics  . Smoking status: Current Every Day Smoker -- 0.50 packs/day    Types: Cigarettes  . Smokeless tobacco: Never Used  . Alcohol Use: No  . Drug Use: Yes    Special: Marijuana     Comment: last used about a month and half ago  . Sexual Activity: Not Currently   Other Topics Concern  . None   Social History Narrative   Additional Social History:   Sleep: improved   Appetite:  improved, has gained some weight  Current Medications: Current Outpatient Prescriptions  Medication Sig Dispense Refill  . benazepril (LOTENSIN) 10 MG tablet Take 10 mg by mouth daily.    . busPIRone (BUSPAR) 15 MG tablet Take 1 tablet (15 mg total) by mouth 2 (two) times  daily. 60 tablet 1  . mirtazapine (REMERON) 30 MG tablet Take 1 tablet (30 mg total) by mouth at bedtime. 30 tablet 1  . venlafaxine XR (EFFEXOR XR) 75 MG 24 hr capsule Take 3 capsules (225 mg total) by mouth daily with breakfast. 90 capsule 1   No current facility-administered medications for this visit.    Lab Results: No results found for this or any previous visit (from the past 48 hour(s)).  Blood Alcohol level:  [facility]: [facility]  Physical Findings: AIMS:  , ,  ,  ,    CIWA:    COWS:     Musculoskeletal: Strength & Muscle Tone: within normal limits Gait & Station: normal Patient  leans: N/A  Psychiatric Specialty Exam: ROS no headache, no SOB, no chest pain reported   Blood pressure 120/80, pulse 98, height 5\' 7"  (1.702 m), weight 168 lb 9.6 oz (76.476 kg).Body mass index is 26.4 kg/(m^2).  General Appearance: Well Groomed  Patent attorneyye Contact::  Good  Speech:  Normal Rate  Volume:  Normal  Mood:  reports ongoing sense of depression, but states he is feeling better  Affect:  clearly improved compared to previous, smiles at times appropriately, more reactive   Thought Process:  Linear  Orientation:  Full (Time, Place, and Person)  Thought Content:  less ruminative, no psychotic symptoms  Suicidal Thoughts:  No at this time denies any suicidal or self injurious ideations   Homicidal Thoughts:  No  Memory:  recent and remote grossly intact  Judgement:  Good  Insight:  improved, present   Psychomotor Activity:  Normal  Concentration:  Good  Recall:  Good  Fund of Knowledge:Good  Language: Good  Akathisia:  Negative  Handed:  Right  AIMS (if indicated):     Assets:  Desire for Improvement Resilience  ADL's:  Intact  Cognition: WNL  Sleep:     Assessment - patient is presenting with improved mood and range of affect - less anhedonic . Reports some ongoing depression, but both him and wife report improvement- at this time no SI. Remains abstinent from cannabis at this time. At this time tolerating medications well.  Treatment Plan Summary: Daily contact with patient to assess and evaluate symptoms and progress in treatment, Medication management, Plan inpatient admission and medications renewed as follows  Increase REMERON to 30 mgrs QHS- for depression Continue EFFEXOR XR at 225 mgrs QDAY- for depression Continue BUSPAR at 15 mgrs BID - for anxiety Continued to encourage ongoing abstinence and recovery efforts to maintain sobriety from cannabis, and have encouraged 12 step participation Continued to encourage smoking cessation efforts  Will see in one month ,  patient agrees to contact me sooner if any worsening prior  Nehemiah MassedOBOS, [name], MD 06/15/2015, 7:55 PM

## 2015-06-29 ENCOUNTER — Ambulatory Visit (INDEPENDENT_AMBULATORY_CARE_PROVIDER_SITE_OTHER): Payer: Medicare Other | Admitting: Psychiatry

## 2015-06-29 VITALS — BP 142/90 | HR 103 | Ht 67.0 in | Wt 170.0 lb

## 2015-06-29 DIAGNOSIS — F332 Major depressive disorder, recurrent severe without psychotic features: Secondary | ICD-10-CM

## 2015-06-30 ENCOUNTER — Encounter (HOSPITAL_COMMUNITY): Payer: Self-pay | Admitting: Psychiatry

## 2015-06-30 NOTE — Progress Notes (Signed)
[facility] MD Progress Note  06/30/2015 5:35 PM Jose Perez  MRN:  [id number] Subjective:  At this time patient reports partial improvement of mood, but states he still feels depressed and not back to his baseline . Him and his wife recently travelled to [place] on vacation, and he states that although he did engage in some sightseeing, he felt vaguely anhedonic during trip. He has not used any cannabis in " three months or so". Denies cravings, and expresses improved insight into how cannabis was contributing to increased psychiatric symptoms. One recent stressor had been having a possession charge , but states he is less concerned about this as it will be dismissed now that he has completed some community service hours . Objective: Patient presents improved compared to prior visits- as noted, reports some ongoing depression, but does acknowledge improvement overall . States he has had no further suicidal ideations  And at this time is future oriented, more focused on stock market gains, how this will affect his ability to retire, and upcoming return to work. 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 Him and his wife also discussed a trip to [place] in the next few months  Wife corroborates overall improvement and states she feels " like I can relax now, I don't feel I have to be watching him all the time any more ". Denies medication side effects. Sleep improved, sometimes " sleeping too much", but attributes this to less daily structure due to being off work. Appetite has improved and has " gained a few pounds ".  Principal Problem:  Major Depression, Recurrent, Severe, and Cannabis Dependence, now in early remission Diagnosis:   Patient Active Problem List   Diagnosis Date Noted  . 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 Major depression (HCC) [F32.9] 03/13/2014  . Cannabis dependence (HCC) [F12.20] 03/13/2014   Total Time spent with patient: 30 minutes    Past Medical History:  Past Medical History  Diagnosis Date  . Cannabis dependence (HCC) 03/13/2014  . Depression   . Hypertension 05/19/14    Past Surgical History  Procedure Laterality Date  . No past surgeries     Family History:  Family History  Problem Relation Age of Onset  . Depression Sister   . Schizophrenia Sister   . Heart attack Father     Social History:  History  Alcohol Use No     History  Drug Use  . Yes  . Special: Marijuana    Comment: last used about a month and half ago    Social History   Social History  . Marital Status: Married    Spouse Name: N/A  . Number of Children: N/A  . Years of Education: N/A   Social History Main Topics  . Smoking status: Current Every Day Smoker -- 0.50 packs/day    Types: Cigarettes  . Smokeless tobacco: Never Used  . Alcohol Use: No  . Drug Use: Yes    Special: Marijuana     Comment: last used about a month and half ago  . Sexual Activity: Not Currently   Other Topics Concern  . Not on file   Social History Narrative  Additional Social History:   Sleep: Good  Appetite:  improved   Current Medications: Current Outpatient Prescriptions  Medication Sig Dispense Refill  . benazepril (LOTENSIN) 10 MG tablet Take 10 mg by mouth daily.    . busPIRone (BUSPAR) 15 MG tablet Take 1 tablet (15 mg total) by mouth 2 (two) times daily. 60 tablet 1  . mirtazapine (REMERON) 30 MG tablet Take 1 tablet (30 mg total) by mouth at bedtime. 30 tablet 1  . venlafaxine XR (EFFEXOR XR) 75 MG 24 hr capsule Take 3 capsules (225 mg total) by mouth daily with breakfast. 90 capsule 1   No current facility-administered medications for this visit.    Lab Results: No results found for  this or any previous visit (from the past 48 hour(s)).  Blood Alcohol level:  No results found for: [facility]  Physical Findings: AIMS:  , ,  ,  ,    CIWA:    COWS:     Musculoskeletal: Strength & Muscle Tone: within normal limits Gait & Station: normal Patient leans: N/A  Psychiatric Specialty Exam: ROS no headache reported, no nausea, no vomiting   Blood pressure 142/90, pulse 103, height 5\' 7"  (1.702 m), weight 170 lb (77.111 kg).Body mass index is 26.62 kg/(m^2).  General Appearance: Well Groomed  Patent attorneyye Contact::  Good  Speech:  Normal Rate  Volume:  Normal  Mood:  improved mood, still feels depressed, but acknowledges improvement , and mood clearly improved compared to prior visits   Affect:  Appropriate and more reactive, smiles at times appropriately, not constricted today  Thought Process:  Linear  Orientation:  Full (Time, Place, and Person)  Thought Content:  denies hallucinations, no delusions, more future oriented, less ruminative   Suicidal Thoughts:  No denies any suicidal or self injurious ideations   Homicidal Thoughts:  No  Memory:  recent and remote grossly intact   Judgement:  Other:  improved   Insight:  improved   Psychomotor Activity:  Normal  Concentration:  Good  Recall:  Good  Fund of Knowledge:Good  Language: Good  Akathisia:  Negative  Handed:  Right  AIMS (if indicated):     Assets:  Desire for Improvement Resilience  ADL's:  Intact  Cognition: WNL  Sleep:     Assessment - although patient continues to report some depression, he acknowledges overall improved mood , which his wife agrees with. He presents with improved mood, improved range of affect , decreased anxiety about returning to work and considerations about retiring soon, and resolution of suicidal / hopeless ruminations he had been having . Denies medication side effects. He is now abstinent from cannabis x about 3 months and I have worked on increasing his insight regarding likely connection  between abstinence and improving mood symptoms as above .  Treatment Plan Summary: Will see in one month Continue medications - Buspar 15 mgrs BID, Effexor XR 225 mgrs QDAY, Remeron 30 mgrs QHS  Encourage 12 step group participation, increased focus on recovery work .   Jose Perez, [name], MD 06/30/2015, 5:35 PM

## 2015-07-01 ENCOUNTER — Telehealth (HOSPITAL_COMMUNITY): Payer: Self-pay

## 2015-07-02 NOTE — Telephone Encounter (Signed)
07/02/15. Patient called, left message requesting call back. I called back, no answer, so left message requesting he call back, or call main operator for MD on call if needing more prompt assistance

## 2015-07-20 ENCOUNTER — Telehealth (HOSPITAL_COMMUNITY): Payer: Self-pay

## 2015-07-27 ENCOUNTER — Ambulatory Visit (HOSPITAL_COMMUNITY): Payer: Self-pay | Admitting: Psychiatry

## 2015-07-28 ENCOUNTER — Encounter (HOSPITAL_COMMUNITY): Payer: Self-pay | Admitting: Psychiatry

## 2015-07-28 ENCOUNTER — Ambulatory Visit (INDEPENDENT_AMBULATORY_CARE_PROVIDER_SITE_OTHER): Payer: Medicare Other | Admitting: Psychiatry

## 2015-07-28 VITALS — BP 126/72 | HR 88 | Ht 67.0 in | Wt 174.0 lb

## 2015-07-28 DIAGNOSIS — F3342 Major depressive disorder, recurrent, in full remission: Secondary | ICD-10-CM

## 2015-07-28 NOTE — Progress Notes (Signed)
[place] MD/PA/NP OP Progress Note  07/28/2015 6:01 PM Jose Perez  MRN:  [id number]  Chief Complaint: patient returns for medication management appointment  Subjective:  He reports significant improvement over the last few weeks - he has been feeling less anxious, less depressed, and reports he has been feeling much better overall. He is no longer feeling apprehensive , anxious , and states he is feeling " like my usual self ". At this time denies anhedonia,or any significant neuro vegetative symptoms of depression. Denies medication side effects. States he has decided to return to work , and has met with his partners to arrange this- is hoping to return to work later this month . Objective : At this time patient presents with improved mood and affect. At this time presents euthymic, with a full range of affect. As above, states that he has been feeling better, and reports improved/normal sleep, appetite, and energy level. His anxiety has abated , and states he no longer feels anxious or apprehensive. Denies anhedonia, and has recently made several trips with family which he has enjoyed, to include recent camping trip with his wife  Denies any  Ongoing cannabis abuse, and reports sobriety/abstinence . 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 [place] he is feeling confident , and that his partners are agreeing for him to come back. 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 Both patient and wife report that he had further and noticeable improvement after Remeron was added to Effexor XR  Patient denies any medication side effects. He is currently on Effexor XR , Buspar and  Remeron.   Visit Diagnosis:  MDD, Severe, Recurrent,  History of Social Anxiety, history of Cannabis Abuse  Past Psychiatric History: as above.   Past Medical History:  Past Medical History  Diagnosis Date  . Cannabis dependence ([place]) 03/13/2014  . Depression   . Hypertension 05/19/14    Past Surgical History  Procedure Laterality Date  . No past surgeries       Family History:  Family History  Problem Relation Age of Onset  . Depression Sister   . Schizophrenia Sister   . Heart attack Father     Social History:  Social History   Social History  . Marital Status: Married    Spouse Name: N/A  . Number of Children: N/A  . Years of Education: N/A   Social History Main Topics  . Smoking status: Current Every Day Smoker -- 0.50 packs/day    Types: Cigarettes  . Smokeless tobacco: Never Used  . Alcohol Use: No  . Drug Use: Yes    Special: Marijuana     Comment: last used about a month and half ago  . Sexual Activity: Not Currently   Other Topics Concern  . Not on file   Social History Narrative    Allergies: No Known Allergies  Metabolic Disorder Labs: No results found for: HGBA1C, MPG No results found for: PROLACTIN No results found for: CHOL, TRIG, HDL, CHOLHDL, VLDL, LDLCALC   Current Medications: Current Outpatient Prescriptions  Medication Sig Dispense Refill  .  benazepril (LOTENSIN) 10 MG tablet Take 10 mg by mouth daily.    . busPIRone (BUSPAR) 15 MG tablet Take 1 tablet (15 mg total) by mouth 2 (two) times daily. 60 tablet 1  . mirtazapine (REMERON) 30 MG tablet Take 1 tablet (30 mg total) by mouth at bedtime. 30 tablet 1  . venlafaxine XR (EFFEXOR XR) 75 MG 24 hr capsule Take 3 capsules (225 mg total) by mouth daily with breakfast. 90 capsule 1   No current facility-administered medications for this visit.    Neurologic: Headache: No Seizure: No Paresthesias: No  Musculoskeletal: Strength & Muscle Tone: within normal limits Gait &  Station: normal Patient leans: N/A  Psychiatric Specialty Exam: ROS denies headaches, denies nausea, no rash.   Blood pressure 126/72, pulse 88, height _0  (1.702 m), weight 174 lb (78.926 kg).Body mass index is 27.25 kg/(m^2).  General Appearance: Well Groomed  Eye Contact:  Good  Speech:  Normal Rate  Volume:  Normal  Mood:  euthymic at this time  Affect:  Appropriate and Full Range  Thought Process:  Linear  Orientation:  Full (Time, Place, and Person)  Thought Content:  denies hallucinations, no delusions   Suicidal Thoughts:  No  Denies suicidal ideations, denies any self injurious ideations   Homicidal Thoughts:  No denies any violent or homicidal ideations   Memory:  recent and remote grossly intact   Judgement:  Other:  improved   Insight:  Present  Psychomotor Activity:  Normal  Concentration:  Good  Recall:  Good  Fund of Knowledge: Good  Language: Good  Akathisia:  Negative  Handed:  Right  AIMS (if indicated):    Assets:  Communication Skills Desire for Improvement Financial Resources/Insurance Resilience Social Support Talents/Skills  ADL's:  Intact  Cognition: WNL  Sleep:  States he is sleeping well    Assessment - at this time patient reports improvement and denies depression, presents euthymic. Anxiety symptoms have also improved significantly . Patient reports good response to Effexor XR / Remeron at this time, and denies side effects. Adding Remeron to Effexor has resulted in further improvement . Denies any recent cannabis use . Wife corroborates improvement and current stability. At this time patient motivated in returning to work.    Treatment Plan Summary:Plan will see in one month, agrees to contact if any worsening or concern prior  and does not need medications renewed at this time, states he has enough meds x 1 month.  At patient's request have provided him with letter documenting he is an outpatient under my care , diagnosis, current status and  releasing to return to work   Neita Garnet, MD 07/28/2015, 6:01 PM

## 2015-08-17 ENCOUNTER — Other Ambulatory Visit (HOSPITAL_COMMUNITY): Payer: Self-pay | Admitting: Psychiatry

## 2015-08-31 ENCOUNTER — Ambulatory Visit (INDEPENDENT_AMBULATORY_CARE_PROVIDER_SITE_OTHER): Payer: Medicare Other | Admitting: Psychiatry

## 2015-08-31 VITALS — BP 118/70 | HR 72 | Ht 67.0 in | Wt 176.0 lb

## 2015-08-31 DIAGNOSIS — F332 Major depressive disorder, recurrent severe without psychotic features: Secondary | ICD-10-CM

## 2015-09-02 ENCOUNTER — Encounter (HOSPITAL_COMMUNITY): Payer: Self-pay | Admitting: Psychiatry

## 2015-09-02 MED ORDER — BUSPIRONE HCL 15 MG PO TABS: 15.0000 mg | ORAL_TABLET | Freq: Two times a day (BID) | ORAL | Status: DC

## 2015-09-02 MED ORDER — VENLAFAXINE HCL ER 75 MG PO CP24
225.0000 mg | ORAL_CAPSULE | Freq: Every day | ORAL | Status: DC
Start: 2015-09-02 — End: 2015-12-22

## 2015-09-02 NOTE — Progress Notes (Unsigned)
[facility] MD/PA/NP OP Progress Note  09/02/2015 5:00 PM Jose Perez  MRN:  [id number]  Chief Complaint: routine med management appt  Subjective:   Patient reports he is doing well. At this time he denies depression, denies any significant anxiety, and denies any cannabis use for several months ( last use 12/16 as per his information). He returned to work a few weeks ago, after taking several weeks off work . States he has done well at work, feels comfortable back in daily routine of full time work, and that his colleagues have been supportive.  Objective : Patient presents improved , currently euthymic. Denies neuro-vegetative symptoms of depression. Denies anhedonia, denies sadness, denies changes in sleep, appetite, energy level at this time. His wife, who comes to appointments with him, corroborates that at this time he is doing well, without significant depression or severe anxiety, and that he is doing well/currently stable. Denies medication side effects, except for some sedation from Remeron, due to which he only takes it when he has the day off the next day. At this time he wants to D/C it .   Visit Diagnosis:  MDD, no Psychotic Symptoms. Social Phobia by history, Cannabis Abuse, currently in sustained remission    Past Medical History:  Past Medical History  Diagnosis Date  . Cannabis dependence ([facility]) 03/13/2014  . Depression   . Hypertension 05/19/14    Past Surgical History  Procedure Laterality Date  . No past surgeries        Family History:  Family History  Problem Relation Age of Onset  . Depression Sister   . Schizophrenia Sister   . Heart attack Father     Social History:  Social History   Social History  . Marital Status: Married    Spouse Name: N/A  . Number of Children: N/A  . Years of Education: N/A   Social History Main Topics  . Smoking status: Current Every Day Smoker -- 0.50 packs/day    Types: Cigarettes  . Smokeless tobacco: Never Used  .  Alcohol Use: No  . Drug Use: Yes    Special: Marijuana     Comment: last used about a month and half ago  . Sexual Activity: Not Currently   Other Topics Concern  . Not on file   Social History Narrative    Allergies: No Known Allergies  Metabolic Disorder Labs: No results found for: HGBA1C, MPG No results found for: PROLACTIN No results found for: CHOL, TRIG, HDL, CHOLHDL, VLDL, LDLCALC   Current Medications: Current Outpatient Prescriptions  Medication Sig Dispense Refill  . benazepril (LOTENSIN) 10 MG tablet Take 10 mg by mouth daily.    . busPIRone (BUSPAR) 15 MG tablet Take 1 tablet (15 mg total) by mouth 2 (two) times daily. 60 tablet 1  . mirtazapine (REMERON) 30 MG tablet TAKE 1 TABLET(30 MG) BY MOUTH AT BEDTIME 30 tablet 0  . venlafaxine XR (EFFEXOR XR) 75 MG 24 hr capsule Take 3 capsules (225 mg total) by mouth daily with breakfast. 90 capsule 1   No current facility-administered medications for this visit.    Neurologic: Headache: No Seizure: No Paresthesias: No  Musculoskeletal: Strength & Muscle Tone: within normal limits Gait & Station: normal Patient leans: N/A  Psychiatric Specialty Exam: ROS denies headaches, denies chest pain, no shortness of breath   Blood pressure 118/70, pulse 72, height  (1.702 m), weight 176 lb (79.833 kg).Body mass index is 27.56 kg/(m^2).  General Appearance: Well Groomed  Eye Contact:  Good  Speech:  Normal Rate  Volume:  Normal  Mood:  Euthymic  Affect:  Full Range  Thought Process:  Linear  Orientation:  Full (Time, Place, and Person)  Thought Content: denies hallucinations, no delusions, not internally preoccupied    Suicidal Thoughts:  No- denies suicidal ideations, denies any self injurious ideations   Homicidal Thoughts:  No  Memory:  recent and remote grossly intact   Judgement:  Other:  improved   Insight:  Present  Psychomotor Activity:  Normal  Concentration:  Concentration: Good and Attention Span:  Good  Recall:  Good  Fund of Knowledge: Good  Language: Good  Akathisia:  Negative  Handed:  Right  AIMS (if indicated):    Assets:  Communication Skills Desire for Improvement Housing Resilience Social Support Vocational/Educational  ADL's:  Intact  Cognition: WNL  Sleep:  Currently sleeping well   Assessment - patient is currently improved and presents  Euthymic, with full range of affect, and without significant anxiety or neuro-vegetative symptoms of depression at this time. Wife corroborates improvement . Patient states he has been sober from cannabis x several months . Has returned to work and at this time denies any acute stressors or issues .   Treatment Plan Summary:Medication management, Plan will see in 4-6 weeks, agrees to return sooner if any worsening prior  and Continue medication management . D/C Remeron due to patient wanting to stop it because of sedation . Continue  Effexor XR and Buspar at same doses  Encouraged to focus on smoking cessation, has cut down , but is still smoking several cigarettes per day.  Nehemiah MassedOBOS, Aysiah Jurado, MD 09/02/2015, 5:00 PM

## 2015-10-19 ENCOUNTER — Ambulatory Visit (HOSPITAL_COMMUNITY): Payer: Self-pay | Admitting: Psychiatry

## 2015-10-28 ENCOUNTER — Other Ambulatory Visit (HOSPITAL_COMMUNITY): Payer: Self-pay | Admitting: Psychiatry

## 2015-11-02 ENCOUNTER — Ambulatory Visit (HOSPITAL_COMMUNITY): Payer: Medicare Other | Admitting: Psychiatry

## 2015-11-02 DIAGNOSIS — F3342 Major depressive disorder, recurrent, in full remission: Secondary | ICD-10-CM

## 2015-11-04 ENCOUNTER — Encounter (HOSPITAL_COMMUNITY): Payer: Self-pay | Admitting: Psychiatry

## 2015-11-04 NOTE — Progress Notes (Signed)
[facility] MD/PA/NP OP Progress Note  11/04/2015 3:57 PM Jose Perez  MRN:  [id number]  Chief Complaint: medication management follow up appointment Subjective:  Patient states he is doing well . 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 At this time he is doing well , denies any significant depression, minimizes neuro-vegetative symptoms of depression ( appetite, energy level, sleep within normal limits, no anhedonia ) . Denies any recent suicidal ideations . Denies anhedonia, recently returned from a kayaking , camping trip with wife which he enjoyed and looking forward to another camping trip to see the upcoming solar eclipse in a few weeks. Denies any current cannabis abuse, last used 12/16 .  Denies medication side effects Visit Diagnosis:  MDD, recurrent, no psychotic features    Past Medical History:  Past Medical History:  Diagnosis Date  . Cannabis dependence ([facility]) 03/13/2014  . Depression   . Hypertension 05/19/14    Past Surgical History:  Procedure Laterality Date  . NO PAST SURGERIES        Family History:  Family History  Problem Relation Age of Onset  . Depression Sister   . Schizophrenia Sister   . Heart attack Father     Social History:  Social History   Social History  . Marital status: Married    Spouse name: N/A  . Number of children: N/A  . Years of education: N/A   Social History Main Topics  . Smoking status: Current Every Day Smoker    Packs/day: 0.50    Types: Cigarettes  . Smokeless tobacco: Never Used  . Alcohol use No  . Drug use:     Types: Marijuana     Comment: last used about a month and half  ago  . Sexual activity: Not Currently   Other Topics Concern  . Not on file   Social History Narrative  . No narrative on file    Allergies: No Known Allergies  Metabolic Disorder Labs: No results found for: HGBA1C, MPG No results found for: PROLACTIN No results found for: CHOL, TRIG, HDL, CHOLHDL, VLDL, LDLCALC   Current Medications: Current Outpatient Prescriptions  Medication Sig Dispense Refill  . benazepril (LOTENSIN) 10 MG tablet Take 10 mg by mouth daily.    . busPIRone (BUSPAR) 15 MG tablet Take 1 tablet (15 mg total) by mouth 2 (two) times daily. 60 tablet 1  . venlafaxine XR (EFFEXOR XR) 75 MG 24 hr capsule Take 3 capsules (225 mg total) by mouth daily with breakfast. 90 capsule 1  . venlafaxine XR (EFFEXOR-XR) 75 MG 24 hr capsule TAKE 3 CAPSULES(225 MG) BY MOUTH DAILY WITH BREAKFAST 90 capsule 0   No current facility-administered medications for this visit.     Neurologic: Headache: No Seizure: No Paresthesias: No  Musculoskeletal: Strength & Muscle Tone: within normal limits Gait & Station: normal Patient leans: N/A  Psychiatric Specialty Exam: ROS no headache, no chest pain, no shortness of breath, no vomiting , no rash   There were no vitals taken for this visit.There is no height or weight on file to calculate BMI.  General Appearance: Well Groomed  Eye  Contact:  Good  Speech:  Normal Rate  Volume:  Normal  Mood:  Euthymic  Affect:  Appropriate and Full Range  Thought Process:  Linear  Orientation:  Full (Time, Place, and Person)  Thought Content: no hallucinations, no delusions, not internally preoccpied    Suicidal Thoughts:  No- denies any suicidal ideations, denies any self injurious ideations, no homicidal ideations   Homicidal Thoughts:  No  Memory:  recent and remote grossly intact   Judgement:  Good  Insight:  Good  Psychomotor Activity:  Normal  Concentration:  Concentration: Good and Attention Span: Good  Recall:  Good  Fund of  Knowledge: Good  Language: Good  Akathisia:  Negative  Handed:  Right  AIMS (if indicated): no abnormal involuntary movements noted or reported   Assets:  Communication Skills Desire for Improvement Financial Resources/Insurance Housing Vocational/Educational  ADL's:  Intact  Cognition: WNL  Sleep:  Normal    Assessment - currently stable, euthymic, doing well in daily activities , including work and home activities. Has been abstinent from Cannabis for several months now. Tolerating medications well at present    Treatment Plan Summary:Medication management and Plan will see in 2 months , sooner if needed   Does not need medications renewed at this time, no medication changes made at present - continue EFFEXOR XR 225 mgrs QDAY and BUSPAR 15 mgrs BID   Jose Massed, MD 11/04/2015, 3:57 PM

## 2015-12-21 ENCOUNTER — Ambulatory Visit (HOSPITAL_COMMUNITY): Payer: Self-pay | Admitting: Psychiatry

## 2015-12-22 ENCOUNTER — Other Ambulatory Visit (HOSPITAL_COMMUNITY): Payer: Self-pay | Admitting: Psychiatry

## 2015-12-22 DIAGNOSIS — F332 Major depressive disorder, recurrent severe without psychotic features: Secondary | ICD-10-CM

## 2016-01-18 ENCOUNTER — Encounter (HOSPITAL_COMMUNITY): Payer: Self-pay | Admitting: Psychiatry

## 2016-01-18 ENCOUNTER — Ambulatory Visit (INDEPENDENT_AMBULATORY_CARE_PROVIDER_SITE_OTHER): Payer: Medicare Other | Admitting: Psychiatry

## 2016-01-18 DIAGNOSIS — F332 Major depressive disorder, recurrent severe without psychotic features: Secondary | ICD-10-CM

## 2016-01-18 MED ORDER — VENLAFAXINE HCL ER 75 MG PO CP24: ORAL_CAPSULE | ORAL | 0 refills | Status: DC

## 2016-01-18 MED ORDER — BUSPIRONE HCL 15 MG PO TABS: 15.0000 mg | ORAL_TABLET | Freq: Two times a day (BID) | ORAL | 1 refills | Status: DC

## 2016-01-18 NOTE — Progress Notes (Unsigned)
[facility] MD/PA/NP OP Progress Note  01/18/2016 2:33 PM Jose Perez  MRN:  [id number]  Chief Complaint: follow up visit for medication management and support  Subjective:  Patient reports that he has been doing relatively well. At this time he denies significant depression . He has had some increased anxiety,worry, as he approaches significant decisions regarding best time to retire, and in particular on what to do, how to stay busy after retirement .He has tended to ruminate about this for a period of months, and had set December as retirement time, but now that he is very close to this time, he feels he should work for several more months, until his daughter graduates college .  Objective : Patient comes in with wife, who provides collateral information. 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 Also , reports stable situation at home, in fact, reports that his wife and him have become closer recently, and have been enjoying short trips and going camping together. Denies depression, denies significant neuro-vegetative symptoms, except for a subjective sense of feeling ambivalent in making decisions, specifically about retirement . As above, states that he had set retirement for this December, but as he gets closer to this date, he is less sure about it, and feels apprehensive . He denies any relapse, and has not used cannabis (or any other drug) in " a long time " ( last year)  As above, endorses vague  Anxiety. Denies panic attacks . He is tolerating EFFEXOR XR and BUSPAR well , and feels  medications are helping, denies side effects.   Visit Diagnosis:    ICD-9-CM ICD-10-CM   1. Major depressive disorder, recurrent, severe without psychotic features ([facility]) 296.33 F33.2 busPIRone (BUSPAR) 15 MG tablet     venlafaxine XR (EFFEXOR-XR) 75 MG 24 hr capsule    Past Psychiatric  History: MDD , Cannabis Abuse in Remission   Past Medical History:  Past Medical History:  Diagnosis Date  . Cannabis dependence ([facility]) 03/13/2014  . Depression   . Hypertension 05/19/14    Past Surgical History:  Procedure Laterality Date  . NO PAST SURGERIES        Family History:  Family History  Problem Relation Age of Onset  . Depression Sister   . Schizophrenia Sister   . Heart attack Father     Social History:  Social History   Social History  . Marital status: Married    Spouse name: N/A  . Number of children: N/A  . Years of education: N/A   Social History Main Topics  . Smoking status: Current Every Day Smoker    Packs/day: 0.50    Types: Cigarettes  . Smokeless tobacco: Never Used  . Alcohol use No  . Drug use:     Types: Marijuana     Comment: last used about a month and half ago  . Sexual activity: Not Currently   Other Topics Concern  . None   Social History Narrative  . None    Allergies: No Known Allergies  Metabolic Disorder Labs: No results found for: HGBA1C, MPG No results found for: PROLACTIN No results found for: CHOL, TRIG, HDL, CHOLHDL, VLDL, LDLCALC   Current Medications: Current Outpatient Prescriptions  Medication Sig Dispense Refill  . benazepril (LOTENSIN) 10 MG tablet Take 10 mg by mouth daily.    . busPIRone (BUSPAR) 15 MG  tablet Take 1 tablet (15 mg total) by mouth 2 (two) times daily. 60 tablet 1  . venlafaxine XR (EFFEXOR-XR) 75 MG 24 hr capsule TAKE 3 CAPSULES(225 MG) BY MOUTH DAILY WITH BREAKFAST 90 capsule 0  . venlafaxine XR (EFFEXOR-XR) 75 MG 24 hr capsule TAKE 3 CAPSULES(225 MG) BY MOUTH DAILY WITH BREAKFAST 90 capsule 0   No current facility-administered medications for this visit.     Neurologic: Headache: Negative Seizure: Negative Paresthesias: Negative  Musculoskeletal: Strength & Muscle Tone: within normal limits Gait & Station: normal Patient leans: N/A  Psychiatric Specialty Exam: ROS no chest  pain, no shortness of breath, no vomiting   Blood pressure 130/88, pulse (!) 103, height 5\' 7"  (1.702 m), weight 173 lb 6.4 oz (78.7 kg).Body mass index is 27.16 kg/m.  General Appearance: Well Groomed  Eye Contact:  Good  Speech:  Normal Rate  Volume:  Normal  Mood:  denies depression, states mood has improved   Affect:  Appropriate and reactive, mildly anxious when discussing stressors   Thought Process:  Linear  Orientation:  Full (Time, Place, and Person)  Thought Content: denies hallucinations, no delusions    Suicidal Thoughts:  No denies suicidal or self injurious ideations, denies homicidal ideations   Homicidal Thoughts:  No  Memory:  recent and remote grossly intact   Judgement:  Other:  present  Insight:  Present  Psychomotor Activity:  Normal  Concentration:  Concentration: Good and Attention Span: Good  Recall:  Good  Fund of Knowledge: Good  Language: Good  Akathisia:  Negative  Handed:  Right  AIMS (if indicated): no abnormal or involuntary movements noted or presented   Assets:  Desire for Improvement Resilience  ADL's:  Intact  Cognition: WNL  Sleep:  sleeping well    Assessment - at this time patient reports he is doing better and denies depression , denies neuro-vegetative symptoms of depression, remains sober , abstinent. As he approaches date he had set for himself to retire, he is dealing with increased anxiety, particularly regarding whether his financial situation will allow comfortable retirement at this time, but mostly regarding what he will do, how he will stay busy, after retirement . Responds well to support , review of coping skills and strategies to mitigate anxiety. Tolerating Effexor XR and Buspar well .  Treatment Plan Summary:Will see in one month, agrees to contact me sooner if any worsening prior  Continue Effexor XR and Buspar at current doses- have renewed scripts via e script. Have encouraged patient to consider therapy in addition to  medication management - patient may benefit from mindfulness based therapy and states he may consider taking a time limited mindfulness course given at [facility] .  Nehemiah Massed, MD 01/18/2016, 2:33 PM

## 2016-01-31 ENCOUNTER — Other Ambulatory Visit (HOSPITAL_COMMUNITY): Payer: Self-pay | Admitting: Psychiatry

## 2016-01-31 DIAGNOSIS — F332 Major depressive disorder, recurrent severe without psychotic features: Secondary | ICD-10-CM

## 2016-02-22 ENCOUNTER — Ambulatory Visit (HOSPITAL_COMMUNITY): Payer: Self-pay | Admitting: Psychiatry

## 2016-03-02 ENCOUNTER — Other Ambulatory Visit (HOSPITAL_COMMUNITY): Payer: Self-pay | Admitting: Psychiatry

## 2016-03-02 DIAGNOSIS — F332 Major depressive disorder, recurrent severe without psychotic features: Secondary | ICD-10-CM

## 2016-03-07 ENCOUNTER — Encounter (HOSPITAL_COMMUNITY): Payer: Self-pay | Admitting: Psychiatry

## 2016-03-07 ENCOUNTER — Ambulatory Visit (INDEPENDENT_AMBULATORY_CARE_PROVIDER_SITE_OTHER): Payer: Medicare Other | Admitting: Psychiatry

## 2016-03-07 VITALS — BP 138/88 | HR 90 | Ht 67.0 in | Wt 177.2 lb

## 2016-03-07 DIAGNOSIS — F3342 Major depressive disorder, recurrent, in full remission: Secondary | ICD-10-CM

## 2016-03-07 DIAGNOSIS — Z818 Family history of other mental and behavioral disorders: Secondary | ICD-10-CM

## 2016-03-07 DIAGNOSIS — Z813 Family history of other psychoactive substance abuse and dependence: Secondary | ICD-10-CM | POA: Diagnosis not present

## 2016-03-07 DIAGNOSIS — F17210 Nicotine dependence, cigarettes, uncomplicated: Secondary | ICD-10-CM

## 2016-03-07 DIAGNOSIS — Z79899 Other long term (current) drug therapy: Secondary | ICD-10-CM

## 2016-03-07 NOTE — Progress Notes (Signed)
BH MD/PA/NP OP Progress Note  03/07/2016 4:20 PM Jose Perez  MRN:  [id number]  Chief Complaint: returns for medication management visit Chief Complaint    Follow-up     Subjective:  Patient reports he is doing well , and denies any recent or significant symptoms of depression or anxiety- he is functioning well in daily activities , including professional work as a Development worker, communityphysician, and at home. Denies medication side effects and states current medications are working well for him. Denies any relapse and reports being sober/abstinent from cannabis, which is his substance of choice, for almost a year at this time.  Objective :  Patient comes to appointments with wife , who provides collateral information. Patient currently reports doing well and does not endorse current symptoms of depression. Wife corroborates that he has been doing well. They both describe a generally improved marital relationship with less tension and they report they have been doing more activities such as camping and social activities together. Wife states that he has clearly been much better and more " himself" since he stopped using cannabis. At this time patient euthymic, with full range of affect, and not endorsing any significant neuro-vegetative symptoms of depression, no SI. No medication side effects. Continues to work full time but has made a final decision to retire in the next 6 months, spoke with his colleagues and wife about this and feels supported in his decision. Aware that this will be a period of transition and readjustment and that " I have to stay active, have some hobbies, keep my friends". Currently denies any cravings for cannabis .  Visit Diagnosis:  MDD, recurrent, severe, no psychotic features, Cannabis Abuse, in remission Past Psychiatric History: as above   Past Medical History:  Past Medical History:  Diagnosis Date  . Cannabis dependence (HCC) 03/13/2014  . Depression   . Hypertension  05/19/14    Past Surgical History:  Procedure Laterality Date  . NO PAST SURGERIES      Family Psychiatric History: history of depression, history of cannabis abuse - brother   Family History:  Family History  Problem Relation Age of Onset  . Depression Sister   . Schizophrenia Sister   . Heart attack Father     Social History:  Social History   Social History  . Marital status: Married    Spouse name: N/A  . Number of children: N/A  . Years of education: N/A   Social History Main Topics  . Smoking status: Current Every Day Smoker    Packs/day: 0.50    Types: Cigarettes  . Smokeless tobacco: Never Used  . Alcohol use No  . Drug use: No     Comment: last used about a month and half ago  . Sexual activity: Yes    Partners: Female    Birth control/ protection: None   Other Topics Concern  . None   Social History Narrative  . None    Allergies: No Known Allergies  Metabolic Disorder Labs: No results found for: HGBA1C, MPG No results found for: PROLACTIN No results found for: CHOL, TRIG, HDL, CHOLHDL, VLDL, LDLCALC   Current Medications: Current Outpatient Prescriptions  Medication Sig Dispense Refill  . benazepril (LOTENSIN) 10 MG tablet Take 10 mg by mouth daily.    . busPIRone (BUSPAR) 15 MG tablet Take 1 tablet (15 mg total) by mouth 2 (two) times daily. 60 tablet 1  . venlafaxine XR (EFFEXOR-XR) 75 MG 24 hr capsule TAKE 3 CAPSULES(225 MG) BY  MOUTH DAILY WITH BREAKFAST 90 capsule 0   No current facility-administered medications for this visit.     Neurologic: Headache: No Seizure: No Paresthesias: No  Musculoskeletal: Strength & Muscle Tone: within normal limits Gait & Station: normal Patient leans: N/A  Psychiatric Specialty Exam: ROS  No headache, no chest pain, no shortness of breath  Blood pressure 138/88, pulse 90, height 5\' 7"  (1.702 m), weight 177 lb 3.2 oz (80.4 kg).Body mass index is 27.75 kg/m.- of note, reports he has been  monitoring BP regularly and that it is normally WNL.  General Appearance: Well Groomed  Eye Contact:  Good  Speech:  Normal Rate  Volume:  Normal  Mood:  Euthymic  Affect:  Full Range  Thought Process:  Linear  Orientation:  Full (Time, Place, and Person)  Thought Content: no hallucinations, no delusions, future oriented   Suicidal Thoughts:  No- denies suicidal or self injurious ideations   Homicidal Thoughts:  No- denies any homicidal or violent ideations   Memory:  recent and remote grossly intact   Judgement:  Other:  improving  Insight:  Present  Psychomotor Activity:  Normal  Concentration:  Concentration: Good and Attention Span: Good  Recall:  Good  Fund of Knowledge: Good  Language: Good  Akathisia:  Negative  Handed:  Right  AIMS (if indicated):  No abnormal or involuntary movements noted or reported   Assets:  Communication Skills Desire for Improvement Resilience Social Support  ADL's:  Intact  Cognition: WNL  Sleep:  Reports he is sleeping well   Assessment - patient reports he is doing well, currently stable , euthymic, without significant anxiety and without severe neuro-vegetative symptoms. Doing well in daily activities. Stability, improvement corroborated by wife. Abstinent from cannabis x close to one year. Tolerating Effexor XR and Buspar well .  Treatment Plan Summary:Plan will see in two months, agrees to contact me sooner if any worsening prior  Continue current medication regimen, no changes in medications or doses at this time, does not currently need scripts  Jose Perez, [name], MD 03/07/2016, 4:20 PM

## 2016-03-09 ENCOUNTER — Telehealth (HOSPITAL_COMMUNITY): Payer: Self-pay

## 2016-03-09 DIAGNOSIS — F332 Major depressive disorder, recurrent severe without psychotic features: Secondary | ICD-10-CM

## 2016-03-09 NOTE — Telephone Encounter (Signed)
Medication refill request - Fax refill request received for patient's Venlafaxine ER. Patient evaluated 03/07/16 and needs new refill orders.

## 2016-03-09 NOTE — Telephone Encounter (Signed)
Hi Jose Perez OK to refill medications as currently prescribed, no changes , x 1 month , 2 refills. Thanks Dr. Jama Flavorsobos

## 2016-03-10 MED ORDER — BUSPIRONE HCL 15 MG PO TABS: 15.0000 mg | ORAL_TABLET | Freq: Two times a day (BID) | ORAL | 2 refills | Status: DC

## 2016-03-10 MED ORDER — VENLAFAXINE HCL ER 75 MG PO CP24: ORAL_CAPSULE | ORAL | 2 refills | Status: DC

## 2016-03-10 NOTE — Telephone Encounter (Signed)
A new Buspar and Venlafaxine XR order with 2 refills e-scribed to patient's [facility] as approved and authorized by Dr. Jama Flavorsobos this date.

## 2016-05-02 ENCOUNTER — Encounter (HOSPITAL_COMMUNITY): Payer: Self-pay | Admitting: Psychiatry

## 2016-05-02 ENCOUNTER — Ambulatory Visit (INDEPENDENT_AMBULATORY_CARE_PROVIDER_SITE_OTHER): Payer: Medicare Other | Admitting: Psychiatry

## 2016-05-02 DIAGNOSIS — F332 Major depressive disorder, recurrent severe without psychotic features: Secondary | ICD-10-CM

## 2016-05-02 DIAGNOSIS — F17210 Nicotine dependence, cigarettes, uncomplicated: Secondary | ICD-10-CM

## 2016-05-02 DIAGNOSIS — Z818 Family history of other mental and behavioral disorders: Secondary | ICD-10-CM

## 2016-05-02 DIAGNOSIS — Z8249 Family history of ischemic heart disease and other diseases of the circulatory system: Secondary | ICD-10-CM | POA: Diagnosis not present

## 2016-05-02 DIAGNOSIS — Z79899 Other long term (current) drug therapy: Secondary | ICD-10-CM

## 2016-05-03 MED ORDER — BUSPIRONE HCL 15 MG PO TABS: 15.0000 mg | ORAL_TABLET | Freq: Two times a day (BID) | ORAL | 2 refills | Status: DC

## 2016-05-03 NOTE — Progress Notes (Signed)
BH MD/PA/NP OP Progress Note  05/03/2016 6:51 PM Lynnette Caffeyhomas A Bartok  MRN:  [id number]  Chief Complaint: returns for medication management  Subjective:  Reports he is doing well, currently not feeling depressed, functioning well in daily activities. He focuses on work related issues- states he has decided to retire in June. Has postponed this in the past, due mainly to pressure /request from partners, but states that this time decision is final and he is comfortable with it .  [place], as his adult daughter lives there. Reports relationship with wife, children much improved , stable, and spoke about good Christmas Holiday with adult children visiting . Denies medication side effects. Denies relapses, no cannabis use in more than a year.  Objective : At this time patient is doing well, stable, euthymic, without neuro-vegetative symptoms. As above, states that he is planning on retiring soon but is more comfortable with this decision at present.  He has been sober , abstinent from cannabis for more than a year now, and attributes this to a much better relationship with adult children, who had been upset about his prior relapse and behavioral changes 1-2 years ago. Tolerating medications well. No side effects. Of note, he also stopped smoking cigarettes since January 1st !  Visit Diagnosis:    ICD-9-CM ICD-10-CM   1. Major depressive disorder, recurrent, severe without psychotic features (HCC) 296.33 F33.2 busPIRone (BUSPAR) 15 MG tablet     venlafaxine XR (EFFEXOR-XR) 75 MG 24 hr capsule    Past Psychiatric History: MDD, Cannabis Abuse   Past Medical History:  Past Medical History:  Diagnosis Date  . Cannabis dependence (HCC) 03/13/2014  . Depression   . Hypertension 05/19/14    Past Surgical History:  Procedure Laterality Date  . NO PAST SURGERIES      Family History:  Family History  Problem Relation Age of Onset  . Depression Sister   .  Schizophrenia Sister   . Heart attack Father     Social History:  Social History   Social History  . Marital status: Married    Spouse name: N/A  . Number of children: N/A  . Years of education: N/A   Social History Main Topics  . Smoking status: Current Every Day Smoker    Packs/day: 0.50    Types: Cigarettes    Start date: 04/10/2016  . Smokeless tobacco: Never Used  . Alcohol use No  . Drug use: No     Comment: last used about a month and half ago  . Sexual activity: Yes    Partners: Female    Birth control/ protection: None   Other Topics Concern  . None   Social History Narrative  . None    Allergies: No Known Allergies  Metabolic Disorder Labs: No results found for: HGBA1C, MPG No results found for: PROLACTIN No results found for: CHOL, TRIG, HDL, CHOLHDL, VLDL, LDLCALC   Current Medications: Current Outpatient Prescriptions  Medication Sig Dispense Refill  . benazepril (LOTENSIN) 10 MG tablet Take 10 mg by mouth daily.    . busPIRone (BUSPAR) 15 MG tablet Take 1 tablet (15 mg total) by mouth 2 (two) times daily. 60 tablet 2  . venlafaxine XR (EFFEXOR-XR) 75 MG 24 hr capsule TAKE 3 CAPSULES(225 MG) BY MOUTH DAILY WITH BREAKFAST 90 capsule 0  . venlafaxine XR (EFFEXOR-XR) 75 MG 24 hr capsule TAKE 3 CAPSULES(225 MG) BY MOUTH DAILY WITH BREAKFAST 90 capsule 2   No current  facility-administered medications for this visit.     Neurologic: Headache: Negative Seizure: Negative Paresthesias: No  Musculoskeletal: Strength & Muscle Tone: within normal limits Gait & Station: normal Patient leans: N/A  Psychiatric Specialty Exam: ROS no headache, no chest pain, no shortness of breath, no vomiting   Blood pressure 136/86, pulse 87, height 5\' 7"  (1.702 m), weight 80.9 kg (178 lb 6.4 oz).Body mass index is 27.94 kg/m.  General Appearance: Well Groomed  Eye Contact:  Good  Speech:  Normal Rate  Volume:  Normal  Mood:  Euthymic  Affect:  Appropriate and Full  Range  Thought Process:  Linear  Orientation:  Full (Time, Place, and Person)  Thought Content: no hallucinations, no delusions    Suicidal Thoughts:  No no suicidal or self injurious ideations  Homicidal Thoughts:  No  Memory:  recent and remote grossly intact   Judgement:  Good  Insight:  Good  Psychomotor Activity:  Normal  Concentration:  Concentration: Good and Attention Span: Good  Recall:  Good  Fund of Knowledge: Good  Language: Good  Akathisia:  Negative  Handed:  Right  AIMS (if indicated):    Assets:  Communication Skills Desire for Improvement Financial Resources/Insurance Resilience Social Support  ADL's:  Intact  Cognition: WNL  Sleep:  Good, normal   Assessment - currently stable, euthymic, doing well in daily activities. In sustained remission.  Has responded well to Effexor XR, which he tolerates well .   Treatment Plan Summary:Medication management and Plan will see in three months , agrees to contact me sooner if any worsening prior Continue same medications, no changes at this time  Nehemiah Massed, MD 05/03/2016, 6:51 PM

## 2016-05-30 ENCOUNTER — Ambulatory Visit (HOSPITAL_COMMUNITY): Payer: Self-pay | Admitting: Psychiatry

## 2016-06-27 ENCOUNTER — Ambulatory Visit (HOSPITAL_COMMUNITY): Payer: Self-pay | Admitting: Psychiatry

## 2016-07-02 ENCOUNTER — Other Ambulatory Visit (HOSPITAL_COMMUNITY): Payer: Self-pay | Admitting: Psychiatry

## 2016-07-02 DIAGNOSIS — F332 Major depressive disorder, recurrent severe without psychotic features: Secondary | ICD-10-CM

## 2016-07-07 ENCOUNTER — Other Ambulatory Visit (HOSPITAL_COMMUNITY): Payer: Self-pay

## 2016-07-07 DIAGNOSIS — F332 Major depressive disorder, recurrent severe without psychotic features: Secondary | ICD-10-CM

## 2016-07-07 MED ORDER — VENLAFAXINE HCL ER 75 MG PO CP24: ORAL_CAPSULE | ORAL | 0 refills | Status: DC

## 2016-07-11 ENCOUNTER — Ambulatory Visit (INDEPENDENT_AMBULATORY_CARE_PROVIDER_SITE_OTHER): Payer: Medicare Other | Admitting: Psychiatry

## 2016-07-11 DIAGNOSIS — F17210 Nicotine dependence, cigarettes, uncomplicated: Secondary | ICD-10-CM

## 2016-07-11 DIAGNOSIS — Z79899 Other long term (current) drug therapy: Secondary | ICD-10-CM

## 2016-07-11 DIAGNOSIS — Z818 Family history of other mental and behavioral disorders: Secondary | ICD-10-CM

## 2016-07-11 DIAGNOSIS — F332 Major depressive disorder, recurrent severe without psychotic features: Secondary | ICD-10-CM

## 2016-07-11 MED ORDER — VENLAFAXINE HCL ER 75 MG PO CP24: ORAL_CAPSULE | ORAL | 0 refills | Status: DC

## 2016-07-11 MED ORDER — BUSPIRONE HCL 15 MG PO TABS: 15.0000 mg | ORAL_TABLET | Freq: Two times a day (BID) | ORAL | 2 refills | Status: DC

## 2016-07-12 ENCOUNTER — Encounter (HOSPITAL_COMMUNITY): Payer: Self-pay | Admitting: Psychiatry

## 2016-07-12 NOTE — Progress Notes (Signed)
BH MD/PA/NP OP Progress Note  07/12/2016 5:25 PM Jose Perez  MRN:  [id number]  Chief Complaint:  medication management visit  Subjective:  He reports he has been doing well . Has been functioning well in daily activities including home and work.  Denies any current significant depression or neuro-vegetative symptoms. Denies any relapses, remains abstinent from cannabis- now in sustained remission for more than one year. States he had recent stressor of going to [place] to visit his brother , and was taken aback by brother avoiding him and refusing to see him. Thinks that this is because brother has relapsed and is drinking, so does not want to face him. States he feels he is dealing with this well, and that " I am available to help him, but he has to ask for help , I have my own life to live ".  Objective : As above,he reports he is doing well, and presents euthymic, with full range of affect. No neuro-vegetative symptoms of depression at this time. States he is functioning well in daily activities, including work and home related. In march, describes having had a brief episode of a few days duration of poor sleep and some nightmares, but this resolved , denies any current symptoms or insomnia. Of note, he stopped smoking a few weeks ago! States he is making an effort to eat better, be more active, and remain nicotine free. Reports feeling excited about upcoming projected retirement from work later this Summer. States he feels his marital relationship has gradually improved and strengthened as he remains stable and sober . Denies medication side effects and feels Effexor XR/Buspar combination has been effective and well tolerated .   Visit Diagnosis:    ICD-9-CM ICD-10-CM   1. Major depressive disorder, recurrent, severe without psychotic features (HCC) 296.33 F33.2 venlafaxine XR (EFFEXOR-XR) 75 MG 24 hr capsule     busPIRone (BUSPAR) 15 MG tablet    Past Psychiatric History:  MDD,  Cannabis Dependence, now sober x 14 months or more   Past Medical History:  Past Medical History:  Diagnosis Date  . Cannabis dependence (HCC) 03/13/2014  . Depression   . Hypertension 05/19/14    Past Surgical History:  Procedure Laterality Date  . NO PAST SURGERIES      Family Psychiatric History:  Family History:  Family History  Problem Relation Age of Onset  . Depression Sister   . Schizophrenia Sister   . Heart attack Father     Social History:  Social History   Social History  . Marital status: Married    Spouse name: N/A  . Number of children: N/A  . Years of education: N/A   Social History Main Topics  . Smoking status: Current Every Day Smoker    Packs/day: 0.50    Types: Cigarettes    Start date: 04/10/2016  . Smokeless tobacco: Never Used  . Alcohol use No  . Drug use: No     Comment: last used about a month and half ago  . Sexual activity: Yes    Partners: Female    Birth control/ protection: None   Other Topics Concern  . Not on file   Social History Narrative  . No narrative on file    Allergies: No Known Allergies  Metabolic Disorder Labs: No results found for: HGBA1C, MPG No results found for: PROLACTIN No results found for: CHOL, TRIG, HDL, CHOLHDL, VLDL, LDLCALC   Current Medications: Current Outpatient Prescriptions  Medication Sig Dispense Refill  .  benazepril (LOTENSIN) 10 MG tablet Take 10 mg by mouth daily.    . busPIRone (BUSPAR) 15 MG tablet Take 1 tablet (15 mg total) by mouth 2 (two) times daily. 60 tablet 2  . venlafaxine XR (EFFEXOR-XR) 75 MG 24 hr capsule TAKE 3 CAPSULES(225 MG) BY MOUTH DAILY WITH BREAKFAST 90 capsule 0   No current facility-administered medications for this visit.     Neurologic: Headache: No Seizure: No Paresthesias: No  Musculoskeletal: Strength & Muscle Tone: within normal limits Gait & Station: normal Patient leans: N/A  Psychiatric Specialty Exam: ROS no chest pain, no shortness of  breath, no vomiting  Blood pressure 120/72, pulse 68, height  (1.702 m), weight 81.4 kg (179 lb 6.4 oz).Body mass index is 28.1 kg/m.  General Appearance: Well Groomed  Eye Contact:  Good  Speech:  Normal Rate  Volume:  Normal  Mood:  Euthymic  Affect:  Appropriate and Full Range  Thought Process:  Linear and Descriptions of Associations: Intact  Orientation:  Full (Time, Place, and Person)  Thought Content: no hallucinations, no delusions, not internally preoccupied    Suicidal Thoughts:  No denies any suicidal or self injurious ideations, no homicidal or violent ideations   Homicidal Thoughts:  No  Memory:  recent and remote grossly intact   Judgement:  Good  Insight:  Good  Psychomotor Activity:  Normal  Concentration:  Concentration: Good and Attention Span: Good  Recall:  Good  Fund of Knowledge: Good  Language: Good  Akathisia:  Negative  Handed:  Right  AIMS (if indicated):  No abnormal or involuntary movements noted or reported  Assets:  Desire for Improvement Resilience  ADL's:  Intact  Cognition: WNL  Sleep:  Currently sleeping well    Assessment - currently stable, doing well in daily activities, euthymic, no neuro-vegetative symptoms, remains sober, abstinent . Making increased efforts to take care of self such as improved diet , physical activity, and stopped smoking a few weeks ago. Tolerating medications well .  Treatment Plan Summary:Medication management and Plan Will see in two to three months,agrees to contact me sooner if any worsening prior  Renew same medications, no medication changes at this time.  Craige Cotta, MD 07/12/2016, 5:25 PM

## 2016-08-20 ENCOUNTER — Other Ambulatory Visit (HOSPITAL_COMMUNITY): Payer: Self-pay | Admitting: Psychiatry

## 2016-08-20 DIAGNOSIS — F332 Major depressive disorder, recurrent severe without psychotic features: Secondary | ICD-10-CM

## 2016-08-24 ENCOUNTER — Other Ambulatory Visit (HOSPITAL_COMMUNITY): Payer: Self-pay

## 2016-08-24 DIAGNOSIS — F332 Major depressive disorder, recurrent severe without psychotic features: Secondary | ICD-10-CM

## 2016-08-24 MED ORDER — VENLAFAXINE HCL ER 75 MG PO CP24: ORAL_CAPSULE | ORAL | 0 refills | Status: DC

## 2016-09-12 ENCOUNTER — Encounter (HOSPITAL_COMMUNITY): Payer: Self-pay | Admitting: Psychiatry

## 2016-09-12 ENCOUNTER — Ambulatory Visit (INDEPENDENT_AMBULATORY_CARE_PROVIDER_SITE_OTHER): Payer: Medicare Other | Admitting: Psychiatry

## 2016-09-12 VITALS — BP 120/72 | HR 95 | Ht 68.0 in | Wt 177.4 lb

## 2016-09-12 DIAGNOSIS — Z818 Family history of other mental and behavioral disorders: Secondary | ICD-10-CM | POA: Diagnosis not present

## 2016-09-12 DIAGNOSIS — F3342 Major depressive disorder, recurrent, in full remission: Secondary | ICD-10-CM

## 2016-09-12 DIAGNOSIS — F1221 Cannabis dependence, in remission: Secondary | ICD-10-CM

## 2016-09-12 DIAGNOSIS — F17210 Nicotine dependence, cigarettes, uncomplicated: Secondary | ICD-10-CM

## 2016-09-13 ENCOUNTER — Other Ambulatory Visit: Payer: Self-pay | Admitting: Psychiatry

## 2016-09-13 DIAGNOSIS — F332 Major depressive disorder, recurrent severe without psychotic features: Secondary | ICD-10-CM

## 2016-09-13 NOTE — Progress Notes (Signed)
BH MD/PA/NP OP Progress Note  09/13/2016 4:51 PM Jose Perez  MRN:  [id number]  Chief Complaint: returns for medication management and support  Subjective:Currently doing well . States mood has been stable, and denies depression. He has been feeling anxious, particularly as he approaches retirement , which he has set for the end of this month. He states that although intellectually he knows he will have the resources to be comfortable, he worries about whether " the money I have will last ", and he has also has had some disagreements with his partners regarding retirement finances. Overall, however, he states he is much calmer about these issues than he had been in the past, and that he is more optimistic that things will go well . Denies medication side effects. Objective :  Patient is presenting stable. At this time presents euthymic, with a full range of affect, and denies any major neuro-vegetative symptoms of depression. Denies suicidal ideations. Denies any recent cannabis use, and remains in sustained remission.  Wife corroborates that patient is currently doing well, stable, and managing stresses inherent to retirement well. We discussed the importance of maintaining some structured activities during retirement and avoiding social isolation. Currently on Effexor XR and Buspar, denies side effects, feels medications are helping  Visit Diagnosis:  MDD , Cannabis Use Disorder in Sustained Remission Past Psychiatric History: MDD , Cannabis Abuse   Past Medical History:  Past Medical History:  Diagnosis Date  . Cannabis dependence (HCC) 03/13/2014  . Depression   . Hypertension 05/19/14    Past Surgical History:  Procedure Laterality Date  . NO PAST SURGERIES      Family Psychiatric History: non contributory  Family History:  Family History  Problem Relation Age of Onset  . Depression Sister   . Schizophrenia Sister   . Heart attack Father     Social History:  Social  History   Social History  . Marital status: Married    Spouse name: N/A  . Number of children: N/A  . Years of education: N/A   Social History Main Topics  . Smoking status: Current Every Day Smoker    Packs/day: 0.50    Types: Cigarettes    Start date: 04/10/2016  . Smokeless tobacco: Never Used  . Alcohol use No  . Drug use: No     Comment: last used about a month and half ago  . Sexual activity: Yes    Partners: Female    Birth control/ protection: None   Other Topics Concern  . None   Social History Narrative  . None    Allergies: No Known Allergies  Metabolic Disorder Labs: No results found for: HGBA1C, MPG No results found for: PROLACTIN No results found for: CHOL, TRIG, HDL, CHOLHDL, VLDL, LDLCALC   Current Medications: Current Outpatient Prescriptions  Medication Sig Dispense Refill  . benazepril (LOTENSIN) 10 MG tablet Take 10 mg by mouth daily.    . busPIRone (BUSPAR) 15 MG tablet Take 1 tablet (15 mg total) by mouth 2 (two) times daily. 60 tablet 2  . venlafaxine XR (EFFEXOR-XR) 75 MG 24 hr capsule TAKE 3 CAPSULES(225 MG) BY MOUTH DAILY WITH BREAKFAST 90 capsule 0   No current facility-administered medications for this visit.     Neurologic: Headache: Negative Seizure: Negative Paresthesias: No  Musculoskeletal: Strength & Muscle Tone: within normal limits Gait & Station: normal Patient leans: N/A  Psychiatric Specialty Exam: ROS denies chest pain, no shortness of breath, no vomiting   Blood  pressure 120/72, pulse 95, height 5\' 8"  (1.727 m), weight 80.5 kg (177 lb 6.4 oz).Body mass index is 26.97 kg/m.  General Appearance: Well Groomed  Eye Contact:  Good  Speech:  Normal Rate  Volume:  Normal  Mood:  euthymic  Affect:  Appropriate and Full Range  Thought Process:  Linear and Descriptions of Associations: Intact  Orientation:  Full (Time, Place, and Person)  Thought Content: no hallucinations, no delusions    Suicidal Thoughts:  No denies  any self injurious or suicidal ideations  Homicidal Thoughts:  No  Memory:  recent and remote grossly intact   Judgement:  Good  Insight:  Present  Psychomotor Activity:  Normal  Concentration:  Concentration: Good and Attention Span: Good  Recall:  Good  Fund of Knowledge: Good  Language: Good  Akathisia:  Negative  Handed:  Right  AIMS (if indicated):  No abnormal or involuntary movements noted or reported   Assets:  Communication Skills Desire for Improvement Resilience  ADL's:  Intact  Cognition: WNL  Sleep:  Sleeping well   Assessment - patient is currently stable and euthymic. He is dealing with the significant stressor of upcoming retirement, but at this time reports he is handling this well, and presents optimistic and future oriented. Remains abstinent from cannabis. Tolerating medications well .  Treatment Plan Summary:Medication management and Plan will see in 2 months but agrees to contact me sooner if any worsening or concern prior  Continue current medications, no medication changes at this time  Craige Cotta, MD 09/13/2016, 4:51 PM

## 2016-10-03 ENCOUNTER — Other Ambulatory Visit (HOSPITAL_COMMUNITY): Payer: Self-pay | Admitting: Psychiatry

## 2016-10-03 ENCOUNTER — Other Ambulatory Visit (HOSPITAL_COMMUNITY): Payer: Self-pay

## 2016-10-03 DIAGNOSIS — F332 Major depressive disorder, recurrent severe without psychotic features: Secondary | ICD-10-CM

## 2016-10-03 MED ORDER — VENLAFAXINE HCL ER 75 MG PO CP24: ORAL_CAPSULE | ORAL | 2 refills | Status: DC

## 2016-10-26 DIAGNOSIS — Z205 Contact with and (suspected) exposure to viral hepatitis: Secondary | ICD-10-CM | POA: Diagnosis not present

## 2016-10-26 DIAGNOSIS — E559 Vitamin D deficiency, unspecified: Secondary | ICD-10-CM | POA: Diagnosis not present

## 2016-10-26 DIAGNOSIS — Z23 Encounter for immunization: Secondary | ICD-10-CM | POA: Diagnosis not present

## 2016-10-26 DIAGNOSIS — Z125 Encounter for screening for malignant neoplasm of prostate: Secondary | ICD-10-CM | POA: Diagnosis not present

## 2016-10-26 DIAGNOSIS — F3341 Major depressive disorder, recurrent, in partial remission: Secondary | ICD-10-CM | POA: Diagnosis not present

## 2016-10-26 DIAGNOSIS — E538 Deficiency of other specified B group vitamins: Secondary | ICD-10-CM | POA: Diagnosis not present

## 2016-10-26 DIAGNOSIS — Z122 Encounter for screening for malignant neoplasm of respiratory organs: Secondary | ICD-10-CM | POA: Diagnosis not present

## 2016-10-26 DIAGNOSIS — Z136 Encounter for screening for cardiovascular disorders: Secondary | ICD-10-CM | POA: Diagnosis not present

## 2016-10-26 DIAGNOSIS — I10 Essential (primary) hypertension: Secondary | ICD-10-CM | POA: Diagnosis not present

## 2016-10-26 DIAGNOSIS — N400 Enlarged prostate without lower urinary tract symptoms: Secondary | ICD-10-CM | POA: Diagnosis not present

## 2016-10-26 DIAGNOSIS — R635 Abnormal weight gain: Secondary | ICD-10-CM | POA: Diagnosis not present

## 2016-10-26 DIAGNOSIS — Z8249 Family history of ischemic heart disease and other diseases of the circulatory system: Secondary | ICD-10-CM | POA: Diagnosis not present

## 2016-11-21 ENCOUNTER — Encounter (HOSPITAL_COMMUNITY): Payer: Self-pay | Admitting: Psychiatry

## 2016-11-21 ENCOUNTER — Ambulatory Visit (INDEPENDENT_AMBULATORY_CARE_PROVIDER_SITE_OTHER): Payer: Medicare Other | Admitting: Psychiatry

## 2016-11-21 DIAGNOSIS — F1221 Cannabis dependence, in remission: Secondary | ICD-10-CM | POA: Diagnosis not present

## 2016-11-21 DIAGNOSIS — F332 Major depressive disorder, recurrent severe without psychotic features: Secondary | ICD-10-CM

## 2016-11-21 DIAGNOSIS — F17210 Nicotine dependence, cigarettes, uncomplicated: Secondary | ICD-10-CM | POA: Diagnosis not present

## 2016-11-21 DIAGNOSIS — Z564 Discord with boss and workmates: Secondary | ICD-10-CM

## 2016-11-21 DIAGNOSIS — Z79899 Other long term (current) drug therapy: Secondary | ICD-10-CM | POA: Diagnosis not present

## 2016-11-21 MED ORDER — VENLAFAXINE HCL ER 75 MG PO CP24: ORAL_CAPSULE | ORAL | 2 refills | Status: DC

## 2016-11-22 NOTE — Progress Notes (Signed)
BH MD/PA/NP OP Progress Note  11/22/2016 4:16 PM Lynnette Caffeyhomas A Savitz  MRN:  [id number]  Chief Complaint:  Returns for medication management follow up  Chief Complaint    Follow-up     Subjective:  Reports he has been doing well overall. He has been facing some significant stressors, and states he had been feeling irritated, frustrated but is now better, and more optimistic , future oriented. Denies any relapses and remains motivated in sobriety at this time. Denies medication side effects. Denies any suicidal ideations. 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 States that some of his colleagues have expressed concern about issues related to salaries, practice finances, and that an initially friendly conversation as to how to best retire became litigious and tense . States he became frustrated because colleagues he has worked with for many years and considered to be friends have now essentially " called me a thief " and threatened litigation. As above, states that he has been feeling better, and has been able, via speaking with family, wife , friends, to " realize that it is what it is and that I am staying focused on retiring". Wife corroborates that patient has been " handling this whole thing pretty well", and that he has not presented with any of the behaviors she has associated with previous relapses or episodes of use . Currently denies depression or neuro-vegetative symptoms of depression. Denies any SI. Reports medications are well tolerated and effective.   Visit Diagnosis:    ICD-10-CM   1. 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  Cannabis Use Disorder, now in sustained remission  Past Medical History:  Past Medical History:  Diagnosis  Date  . Cannabis dependence (HCC) 03/13/2014  . Depression   . Hypertension 05/19/14    Past Surgical History:  Procedure Laterality Date  . NO PAST SURGERIES      Family Psychiatric History: non contributory   Family History:  Family History  Problem Relation Age of Onset  . Depression Sister   . Schizophrenia Sister   . Heart attack Father     Social History:  Social History   Social History  . Marital status: Married    Spouse name: N/A  . Number of children: N/A  . Years of education: N/A   Social History Main Topics  . Smoking status: Current Every Day Smoker    Packs/day: 0.50    Types: Cigarettes    Start date: 04/10/2016  . Smokeless tobacco: Never Used  . Alcohol use No  . Drug use: No     Comment: last used about a month and half ago  . Sexual activity: Yes    Partners: Female    Birth control/ protection: None   Other Topics Concern  . None   Social History Narrative  . None    Allergies: No Known Allergies  Metabolic Disorder Labs: No results found for: HGBA1C, MPG No results found for: PROLACTIN No results found for: CHOL, TRIG, HDL, CHOLHDL, VLDL, LDLCALC   Current Medications: Current Outpatient Prescriptions  Medication Sig Dispense Refill  . benazepril (LOTENSIN) 10 MG tablet Take 10 mg by mouth daily.    . busPIRone (BUSPAR) 15 MG tablet Take 1 tablet (15 mg total) by  mouth 2 (two) times daily. 60 tablet 2  . venlafaxine XR (EFFEXOR-XR) 75 MG 24 hr capsule TAKE 3 CAPSULES(225 MG) BY MOUTH DAILY WITH BREAKFAST 90 capsule 2   No current facility-administered medications for this visit.     Neurologic: Headache: No Seizure: No Paresthesias: No  Musculoskeletal: Strength & Muscle Tone: within normal limits Gait & Station: normal Patient leans: N/A  Psychiatric Specialty Exam: ROS no chest pain, no shortness of breath  Blood pressure 140/78, pulse 91, height 5\' 9"  (1.753 m), weight 81.6 kg (180 lb), SpO2 98 %.Body mass index is  26.58 kg/m.  General Appearance: Well Groomed  Eye Contact:  Good  Speech:  Normal Rate  Volume:  Normal  Mood:  stable, denies depression and presents euthymic  Affect:  Appropriate and Full Range  Thought Process:  Linear and Descriptions of Associations: Intact  Orientation:  Full (Time, Place, and Person)  Thought Content: no hallucinations, no delusions, not internally preoccupied    Suicidal Thoughts:  No denies any suicidal or self injurious ideations, denies any homicidal or violent ideations   Homicidal Thoughts:  No  Memory:  recent and remote grossly intact   Judgement:  Other:  present   Insight:  Present  Psychomotor Activity:  Normal  Concentration:  Concentration: Good and Attention Span: Good  Recall:  Good  Fund of Knowledge: Good  Language: Good  Akathisia:  Negative  Handed:  Right  AIMS (if indicated):  No abnormal or involuntary movements noted or reported   Assets:  Communication Skills Desire for Improvement Resilience  ADL's:  Intact  Cognition: WNL  Sleep:  Sleeping well    Assessment - patient reports increased psychosocial stressors recently related 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  States initially frustrated , angry about this , but now feeling better, and denies any current depression or neuro-vegetative symptoms. He denies any Cannabis relapse or any increased cravings ( prior losses/events have triggered relapses, such as death of sisters ) . Wife corroborates he is doing well . Tolerating medications well .   Treatment Plan Summary:Medication management and Plan will see in 6-8 weeks , agrees to return sooner if any worsening prior  Renew same medications, no dose or medication changes at this time Craige Cotta, MD 11/22/2016, 4:16 PM

## 2016-11-28 ENCOUNTER — Ambulatory Visit (HOSPITAL_COMMUNITY): Payer: Self-pay | Admitting: Psychiatry

## 2017-01-23 ENCOUNTER — Ambulatory Visit (HOSPITAL_COMMUNITY): Payer: Self-pay | Admitting: Psychiatry

## 2017-02-01 DIAGNOSIS — Z720 Tobacco use: Secondary | ICD-10-CM | POA: Diagnosis not present

## 2017-02-01 DIAGNOSIS — I10 Essential (primary) hypertension: Secondary | ICD-10-CM | POA: Diagnosis not present

## 2017-02-01 DIAGNOSIS — Z87891 Personal history of nicotine dependence: Secondary | ICD-10-CM | POA: Diagnosis not present

## 2017-02-01 DIAGNOSIS — Z136 Encounter for screening for cardiovascular disorders: Secondary | ICD-10-CM | POA: Diagnosis not present

## 2017-02-09 DIAGNOSIS — Z136 Encounter for screening for cardiovascular disorders: Secondary | ICD-10-CM | POA: Diagnosis not present

## 2017-02-09 DIAGNOSIS — Z122 Encounter for screening for malignant neoplasm of respiratory organs: Secondary | ICD-10-CM | POA: Diagnosis not present

## 2017-02-09 DIAGNOSIS — Z720 Tobacco use: Secondary | ICD-10-CM | POA: Diagnosis not present

## 2017-02-09 DIAGNOSIS — Z87891 Personal history of nicotine dependence: Secondary | ICD-10-CM | POA: Diagnosis not present

## 2017-02-13 ENCOUNTER — Ambulatory Visit (HOSPITAL_COMMUNITY): Payer: Self-pay | Admitting: Psychiatry

## 2017-02-20 ENCOUNTER — Encounter (HOSPITAL_COMMUNITY): Payer: Self-pay | Admitting: Psychiatry

## 2017-02-20 ENCOUNTER — Ambulatory Visit (INDEPENDENT_AMBULATORY_CARE_PROVIDER_SITE_OTHER): Payer: Medicare Other | Admitting: Psychiatry

## 2017-02-20 VITALS — BP 132/76 | HR 88 | Ht 66.0 in | Wt 185.2 lb

## 2017-02-20 DIAGNOSIS — F332 Major depressive disorder, recurrent severe without psychotic features: Secondary | ICD-10-CM

## 2017-02-20 DIAGNOSIS — Z818 Family history of other mental and behavioral disorders: Secondary | ICD-10-CM

## 2017-02-20 DIAGNOSIS — Z87891 Personal history of nicotine dependence: Secondary | ICD-10-CM

## 2017-02-20 DIAGNOSIS — F3342 Major depressive disorder, recurrent, in full remission: Secondary | ICD-10-CM

## 2017-02-20 MED ORDER — VENLAFAXINE HCL ER 75 MG PO CP24: ORAL_CAPSULE | ORAL | 2 refills | Status: DC

## 2017-02-20 MED ORDER — BUSPIRONE HCL 15 MG PO TABS: 15.0000 mg | ORAL_TABLET | Freq: Two times a day (BID) | ORAL | 2 refills | Status: DC

## 2017-02-21 NOTE — Progress Notes (Signed)
BH MD/PA/NP OP Progress Note  02/21/2017 6:34 PM Jose Perez  MRN:  [id number]  Chief Complaint: returns for medication management  HPI: 66 year old male, MD , history of MDD, history of cannabis use disorder now in sustained remission. Returns for medication management. Reports he has been doing well , and denies depression or significant neuro-vegetative symptoms. No anhedonia, no pervasive sadness at this time.  He is facing some ongoing stressors, mainly work related- had been hoping to retire soon, but has been unable to due to some litigation with partners. States he has been able to " take in stride ", and denies this stressor causing increased anxiety or depression. Wife corroborates he has been stable . He has been on Effexor XR for many years ( 11+) and has tolerated this medication well, with good response .  Visit Diagnosis:    ICD-10-CM   1. 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   Past Medical History:  Past Medical History:  Diagnosis Date  . Cannabis dependence (HCC) 03/13/2014  . Depression   . Hypertension 05/19/14    Past Surgical History:  Procedure Laterality Date  . NO PAST SURGERIES      Family Psychiatric History: non contributory   Family History:  Family History  Problem Relation Age of Onset  . Depression Sister   . Schizophrenia Sister   . Heart attack Father     Social History:  Social History   Socioeconomic History  . Marital status: Married    Spouse name: None  . Number of children: None  . Years of education: None  . Highest education level: None  Social Needs  . Financial resource strain: Patient refused  . Food insecurity - worry: Patient refused  . Food insecurity - inability: Patient refused  .  Transportation needs - medical: Patient refused  . Transportation needs - non-medical: Patient refused  Occupational History  . None  Tobacco Use  . Smoking status: Former Smoker    Packs/day: 0.50    Types: Cigarettes    Start date: 04/10/2016    Last attempt to quit: 04/10/2016    Years since quitting: 0.8  . Smokeless tobacco: Never Used  Substance and Sexual Activity  . Alcohol use: No    Alcohol/week: 0.0 oz  . Drug use: No    Comment: last used about a month and half ago  . Sexual activity: Yes    Partners: Female    Birth control/protection: None  Other Topics Concern  . None  Social History Narrative  . None    Allergies: No Known Allergies  Metabolic Disorder Labs: No results found for: HGBA1C, MPG No results found for: PROLACTIN No results found for: CHOL, TRIG, HDL, CHOLHDL, VLDL, LDLCALC No results found for: TSH  Therapeutic Level Labs: No results found for: LITHIUM No results found for: VALPROATE No components found for:  CBMZ  Current Medications: Current Outpatient Medications  Medication Sig Dispense Refill  . benazepril (LOTENSIN) 10 MG tablet Take 10 mg by mouth daily.    . busPIRone (BUSPAR) 15 MG tablet Take 1 tablet (15 mg total) 2 (two) times daily by mouth. 60 tablet 2  . venlafaxine XR (EFFEXOR-XR) 75 MG 24 hr capsule TAKE 3 CAPSULES(225 MG) BY MOUTH DAILY WITH BREAKFAST 90  capsule 2   No current facility-administered medications for this visit.      Musculoskeletal: Strength & Muscle Tone: within normal limits Gait & Station: normal Patient leans: N/A  Psychiatric Specialty Exam: ROS no chest pain, no shortness of breath  Blood pressure 132/76, pulse 88, height 5\' 6"  (1.676 m), weight 84 kg (185 lb 3.2 oz).Body mass index is 29.89 kg/m.  General Appearance: Well Groomed  Eye Contact:  Good  Speech:  Normal Rate  Volume:  Normal  Mood:  Euthymic  Affect:  Appropriate and Full Range  Thought Process:  Linear and Descriptions of  Associations: Intact  Orientation:  Full (Time, Place, and Person)  Thought Content: denies hallucinations, no delusions, not internally preoccupied    Suicidal Thoughts:  No- denies suicidal or self injurious ideations, denies homicidal or violent ideations   Homicidal Thoughts:  No  Memory:  recent and remote grossly intact   Judgement:  Other:  improved   Insight:  Present  Psychomotor Activity:  Normal  Concentration:  Concentration: Good and Attention Span: Good  Recall:  Good  Fund of Knowledge: Good  Language: Good  Akathisia:  Yes  Handed:  Right  AIMS (if indicated): No abnormal movements reported or noted   Assets:  Communication Skills Desire for Improvement Resilience Social Support  ADL's:  Intact  Cognition: WNL  Sleep:  Good   Screenings: ECT-MADRS     [facility]  MADRS Total Score  33    Mini-Mental     [facility]  Total Score (max 30 points )  30       Assessment and Plan: currently patient is doing well and presents euthymic, with full range of affect, functioning well in daily activities. Wife corroborates he is stable and doing well . Tolerating Effexor XR well , which he has been on for years. Continue current medications as prescribed, no dose changes at this time. Will see in three months, agrees to return sooner if any worsening prior .   Craige CottaFernando A Elycia Woodside, MD 02/21/2017, 6:34 PM

## 2017-03-03 ENCOUNTER — Other Ambulatory Visit (HOSPITAL_COMMUNITY): Payer: Self-pay | Admitting: Psychiatry

## 2017-03-03 DIAGNOSIS — F332 Major depressive disorder, recurrent severe without psychotic features: Secondary | ICD-10-CM

## 2017-05-22 ENCOUNTER — Ambulatory Visit (HOSPITAL_COMMUNITY): Payer: Self-pay | Admitting: Psychiatry

## 2017-06-05 ENCOUNTER — Ambulatory Visit (HOSPITAL_COMMUNITY): Payer: Self-pay | Admitting: Psychiatry

## 2017-06-14 ENCOUNTER — Ambulatory Visit (INDEPENDENT_AMBULATORY_CARE_PROVIDER_SITE_OTHER): Payer: Medicare Other | Admitting: Psychiatry

## 2017-06-14 DIAGNOSIS — Z818 Family history of other mental and behavioral disorders: Secondary | ICD-10-CM

## 2017-06-14 DIAGNOSIS — Z87891 Personal history of nicotine dependence: Secondary | ICD-10-CM

## 2017-06-14 DIAGNOSIS — F332 Major depressive disorder, recurrent severe without psychotic features: Secondary | ICD-10-CM

## 2017-06-14 MED ORDER — VENLAFAXINE HCL ER 75 MG PO CP24: ORAL_CAPSULE | ORAL | 2 refills | Status: DC

## 2017-06-14 MED ORDER — BUSPIRONE HCL 15 MG PO TABS: 15.0000 mg | ORAL_TABLET | Freq: Two times a day (BID) | ORAL | 2 refills | Status: DC

## 2017-06-15 ENCOUNTER — Encounter (HOSPITAL_COMMUNITY): Payer: Self-pay | Admitting: Psychiatry

## 2017-06-15 NOTE — Progress Notes (Signed)
BH MD/PA/NP OP Progress Note  06/15/2017 6:48 PM Jose Perez  MRN:  [id number]  Chief Complaint:  returns for medication management . HPI: 67 year old male, MD, who has history of MDD and a history of Cannabis Use Disorder, now in sustained remission . He returns for medication management appointment. Reports he has been doing well, and denies depression or significant neuro-vegetative symptoms of depression. 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 States that his retirement process has unfortunately been made more difficult by tension between partners and threats of litigation, but states he feels he has dealt with these issues well and " have not let them bother me ". At this time he is future oriented, and states his wife and him are looking into buying a house in [place] area to be closer to his daughter ( the expectation is she is going to have children soon so they can assist with babysitting and being part of their grandchild's life) Currently denies medication side effects, states Buspar /Effexor XR combination has been effective and well tolerated . Denies any cravings for cannabis, and remains sober (  years )     Visit Diagnosis:    ICD-10-CM   1. 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 Cannabis dependence (HCC) 03/13/2014  . Depression   . Hypertension 05/19/14    Past Surgical History:  Procedure Laterality Date  . NO PAST SURGERIES      Family Psychiatric History:   Family History:  Family History  Problem Relation Age of Onset  . Depression Sister   . Schizophrenia Sister   . Heart  attack Father     Social History:  Social History   Socioeconomic History  . Marital status: Married    Spouse name: Not on file  . Number of children: Not on file  . Years of education: Not on file  . Highest education level: Not on file  Social Needs  . Financial resource strain: Patient refused  . Food insecurity - worry: Patient refused  . Food insecurity - inability: Patient refused  . Transportation needs - medical: Patient refused  . Transportation needs - non-medical: Patient refused  Occupational History  . Not on file  Tobacco Use  . Smoking status: Former Smoker    Packs/day: 0.50    Types: Cigarettes    Start date: 04/10/2016    Last attempt to quit: 04/10/2016    Years since quitting: 1.1  . Smokeless tobacco: Never Used  Substance and Sexual Activity  . Alcohol use: No    Alcohol/week: 0.0 oz  . Drug use: No    Comment: last used about a month and half ago  . Sexual activity: Yes    Partners: Female    Birth control/protection: None  Other Topics Concern  . Not on file  Social History Narrative  . Not on file    Allergies: No Known Allergies  Metabolic Disorder Labs: No results found for: HGBA1C, MPG No results found for: PROLACTIN No results found for: CHOL, TRIG, HDL, CHOLHDL, VLDL, LDLCALC No results found for: TSH  Therapeutic Level Labs: No results found for: LITHIUM No results found for: VALPROATE No components found for:  CBMZ  Current Medications: Current Outpatient Medications  Medication Sig Dispense Refill  . benazepril (LOTENSIN) 10 MG tablet Take 10 mg by mouth daily.    . busPIRone (BUSPAR) 15 MG tablet Take 1 tablet (15 mg total) by mouth 2 (two) times daily. 60 tablet 2  . venlafaxine XR (EFFEXOR-XR) 75 MG 24 hr capsule TAKE 3 CAPSULES(225 MG) BY MOUTH DAILY WITH BREAKFAST 90 capsule 2   No current facility-administered medications for this visit.      Musculoskeletal: Strength & Muscle Tone: within normal limits Gait &  Station: normal Patient leans: N/A  Psychiatric Specialty Exam: ROS no nausea, no vomiting,   Blood pressure 130/74, temperature (!) 88 F (31.1 C), height 5\' 7"  (1.702 m), weight 85.3 kg (188 lb).Body mass index is 29.44 kg/m.  General Appearance: Well Groomed  Eye Contact:  Good  Speech:  Normal Rate  Volume:  Normal  Mood:  euthymic  Affect:  Appropriate and Full Range  Thought Process:  Linear and Descriptions of Associations: Intact  Orientation:  Full (Time, Place, and Person)  Thought Content: no hallucinations, no delusions, not internally preoccupied    Suicidal Thoughts:  No  Homicidal Thoughts:  No  Memory:  recent and remote grossly intact   Judgement:  Good  Insight:  Good  Psychomotor Activity:  Normal  Concentration:  Concentration: Good and Attention Span: Good  Recall:  Good  Fund of Knowledge: Good  Language: Good  Akathisia:  Negative  Handed:  Right  AIMS (if indicated):   Assets:  Communication Skills Desire for Improvement Financial Resources/Insurance Housing Resilience Social Support  ADL's:  Intact  Cognition: WNL  Sleep:  Good   Screenings: ECT-MADRS     ECT Treatment from [facility]/05/2015 [facility]  MADRS Total Score  33    Mini-Mental     ECT Treatment from 05/05/2015 in [facility]  Total Score (max 30 points )  30       Assessment and Plan: Currently stable, euthymic , without depression, in spite of significant life changes and stressors ( 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 Tolerating Effexor XR and Buspar combination well.  Remains sober and in sustained remission from cannabis . Continue current medication regimen Effexor XR 225 mgrs QDAY Buspar 15 mgrs BID Will see in two months   Craige Cotta, MD 06/15/2017, 6:48 PM

## 2017-06-16 IMAGING — CR DG CHEST 2V
1 series · 2 of 2 positions shown · non-contrast
Comparison: None.

CLINICAL DATA: Preop for ECT

EXAM:
CHEST  2 VIEW

[Series 1: w chest pa · 0.14mm/px · 2 of 2 slices shown]
[im 1/2]
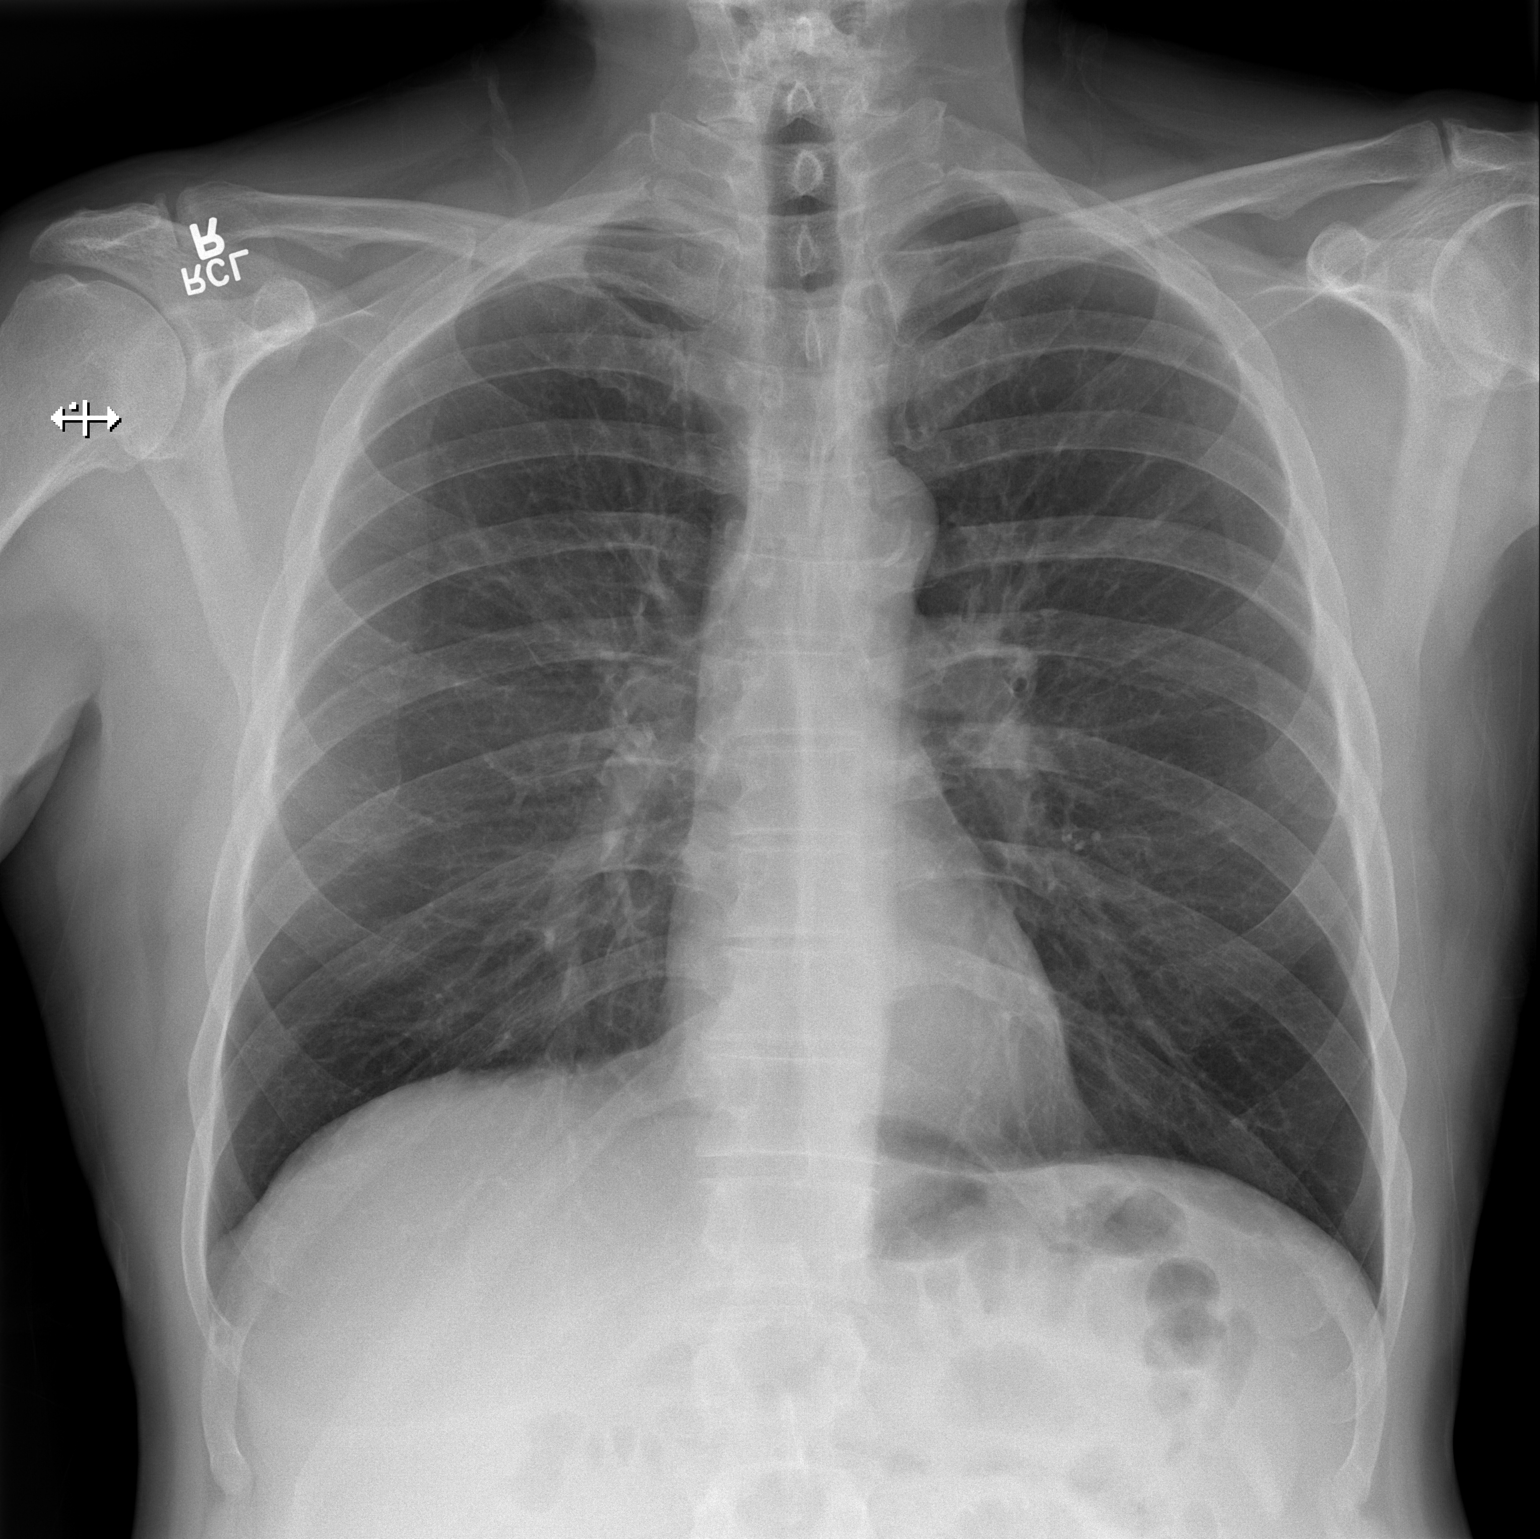
[im 2/2]
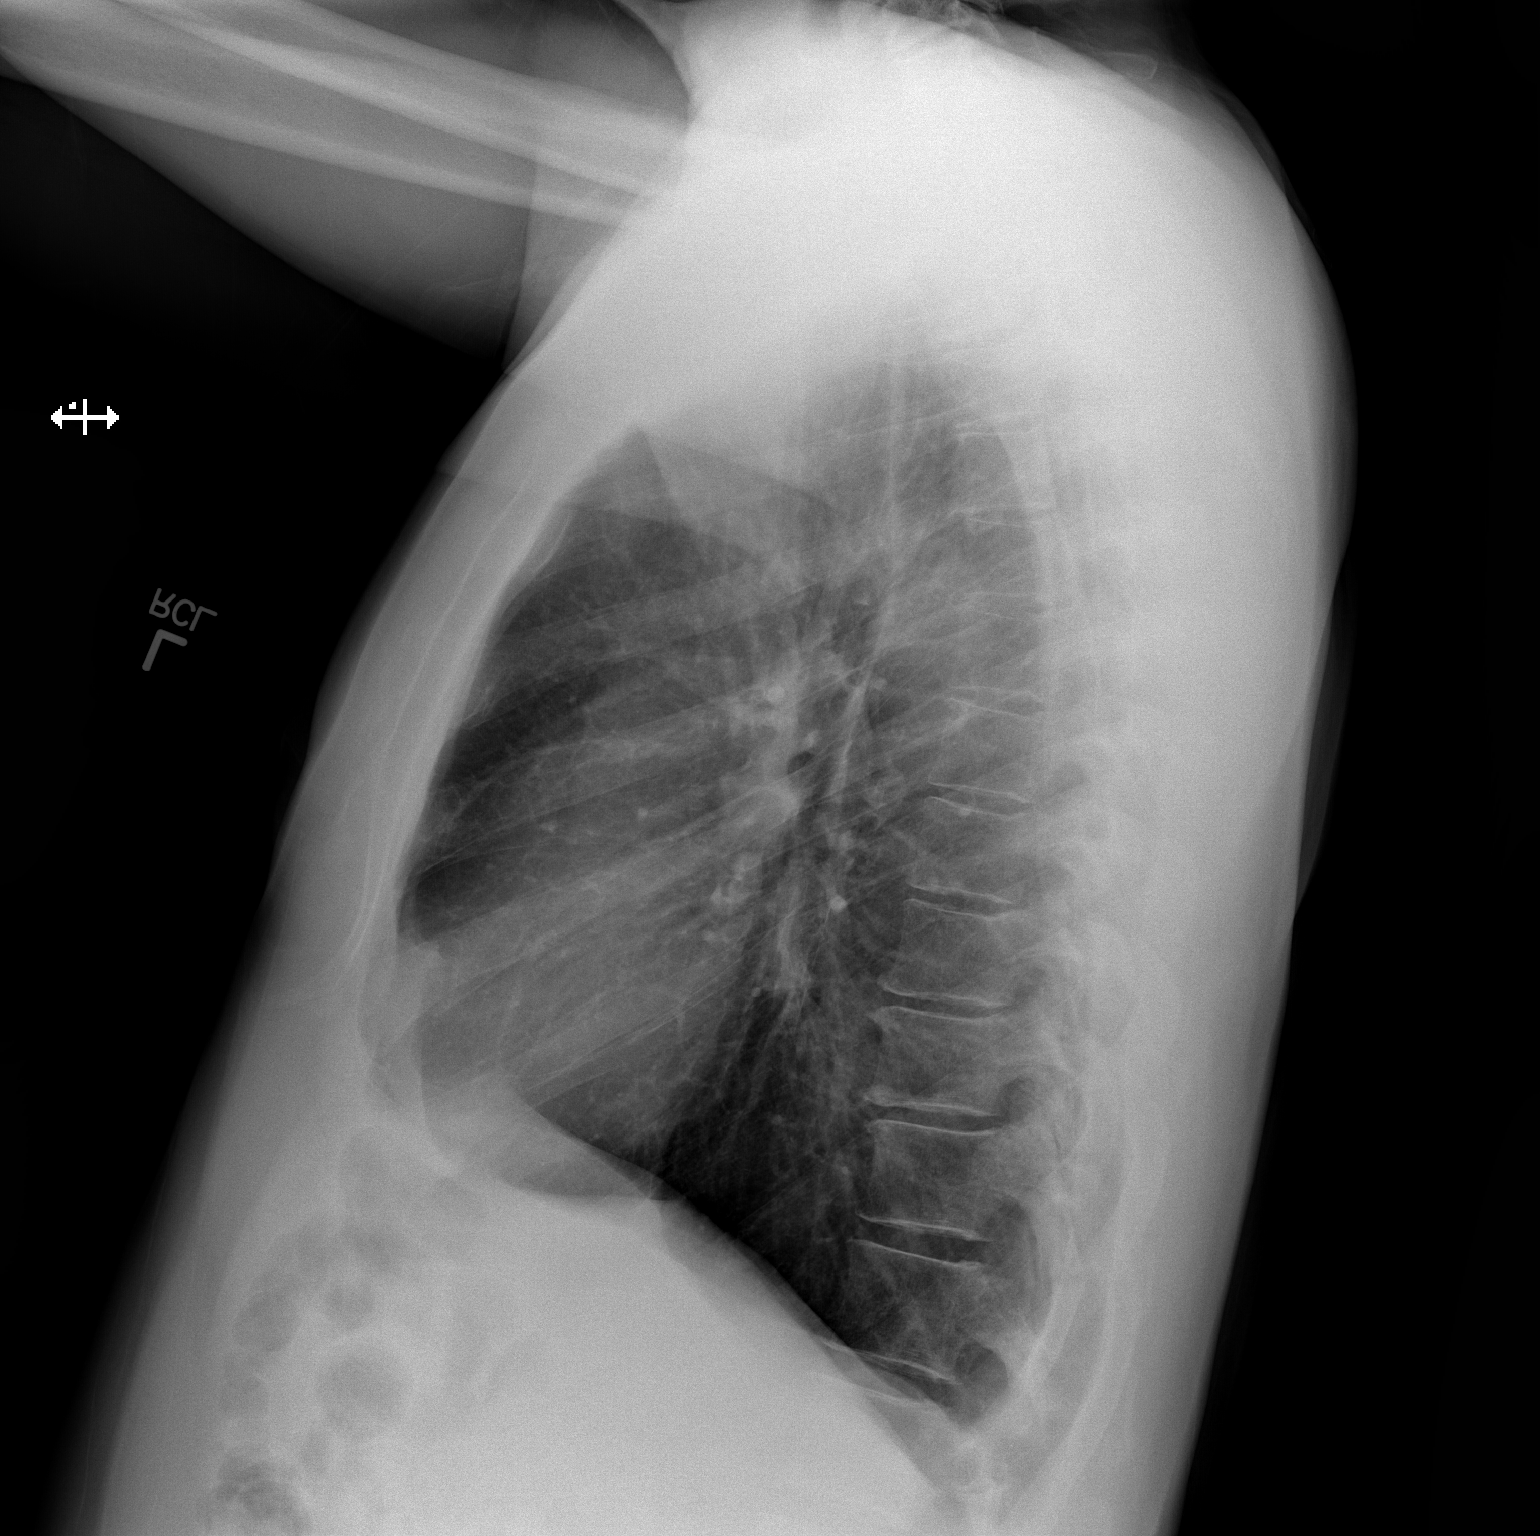

[2 of 2 positions shown; findings below may reference images not displayed]

FINDINGS: The heart size and mediastinal contours are within normal limits.
Both lungs are clear. The visualized skeletal structures are
unremarkable.
IMPRESSION: No active cardiopulmonary disease.

## 2017-08-13 DIAGNOSIS — E782 Mixed hyperlipidemia: Secondary | ICD-10-CM | POA: Diagnosis not present

## 2017-08-13 DIAGNOSIS — I10 Essential (primary) hypertension: Secondary | ICD-10-CM | POA: Diagnosis not present

## 2017-08-13 DIAGNOSIS — F411 Generalized anxiety disorder: Secondary | ICD-10-CM | POA: Diagnosis not present

## 2017-08-13 DIAGNOSIS — F3341 Major depressive disorder, recurrent, in partial remission: Secondary | ICD-10-CM | POA: Diagnosis not present

## 2017-08-14 ENCOUNTER — Encounter (HOSPITAL_COMMUNITY): Payer: Self-pay | Admitting: Psychiatry

## 2017-08-14 ENCOUNTER — Ambulatory Visit (INDEPENDENT_AMBULATORY_CARE_PROVIDER_SITE_OTHER): Payer: Medicare Other | Admitting: Psychiatry

## 2017-08-14 DIAGNOSIS — F1221 Cannabis dependence, in remission: Secondary | ICD-10-CM

## 2017-08-14 DIAGNOSIS — Z818 Family history of other mental and behavioral disorders: Secondary | ICD-10-CM | POA: Diagnosis not present

## 2017-08-14 DIAGNOSIS — F332 Major depressive disorder, recurrent severe without psychotic features: Secondary | ICD-10-CM

## 2017-08-14 DIAGNOSIS — Z87891 Personal history of nicotine dependence: Secondary | ICD-10-CM

## 2017-08-14 MED ORDER — BUSPIRONE HCL 15 MG PO TABS: 15.0000 mg | ORAL_TABLET | Freq: Two times a day (BID) | ORAL | 2 refills | Status: DC

## 2017-08-14 MED ORDER — VENLAFAXINE HCL ER 75 MG PO CP24: ORAL_CAPSULE | ORAL | 2 refills | Status: DC

## 2017-08-14 NOTE — Progress Notes (Signed)
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 She recently officially retired, and this is his second week of "retired life".  Currently he is very happy.  The process leading up to retirement had been stressful as there had been conflict with partners, threats of lawsuits regarding partnership monies, but he states that he feels he managed all of the stressors well, and was able to successfully navigate retirement process. He is currently focusing on selling his current house and buying another one close to 1 of his adult daughters.  Wife came with him to this appointment and corroborates that he is doing very well and that at this time there are no significant issues. Currently he is doing very well, he denies depression or neurovegetative symptoms of depression, no suicidal ideations, and tolerating psychiatric medications well without side effects. Anxiety has also tended to improve over time, and symptoms of social phobia/anxiety have abated. He has been sober x more than 2 years, and prior to that had several years of sobriety as well. We discussed the importance of maintaining focus on sobriety, particularly at this time, since with retirement and no longer practicing, locus of control shifts from external factors to more internal factors ( ie: No longer concerned about licensure issues) He is tolerating medications well, no side effects Visit Diagnosis:    ICD-10-CM   1. 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  Social phobia.  Cannabis use disorder  Past Medical History:  Past Medical History:  Diagnosis  Date  . Cannabis dependence (HCC) 03/13/2014  . Depression   . Hypertension 05/19/14    Past Surgical History:  Procedure Laterality Date  . NO PAST SURGERIES      Family Psychiatric History:   Family History:  Family History  Problem Relation Age of Onset  . Depression Sister   . Schizophrenia Sister   . Heart attack Father     Social History:  Social History   Socioeconomic History  . Marital status: Married    Spouse name: Not on file  . Number of children: Not on file  . Years of education: Not on file  . Highest education level: Not on file  Occupational History  . Not on file  Social Needs  . Financial resource strain: Patient refused  . Food insecurity:    Worry: Patient refused    Inability: Patient refused  . Transportation needs:    Medical: Patient refused    Non-medical: Patient refused  Tobacco Use  . Smoking status: Former Smoker    Packs/day: 0.50    Types: Cigarettes    Start date: 04/10/2016    Last attempt to quit: 04/10/2016    Years since quitting: 1.3  . Smokeless tobacco: Former Engineer, water and Sexual Activity  . Alcohol use: No    Alcohol/week: 0.0 oz  . Drug use: No    Types: Marijuana    Comment: last used about a month and half ago  . Sexual activity: Yes    Partners: Female    Birth control/protection: None  Lifestyle  . Physical activity:    Days per week: Patient refused  Minutes per session: Patient refused  . Stress: Patient refused  Relationships  . Social connections:    Talks on phone: Patient refused    Gets together: Patient refused    Attends religious service: Patient refused    Active member of club or organization: Patient refused    Attends meetings of clubs or organizations: Patient refused    Relationship status: Patient refused  Other Topics Concern  . Not on file  Social History Narrative  . Not on file    Allergies: No Known Allergies  Metabolic Disorder Labs: No results found for: HGBA1C,  MPG No results found for: PROLACTIN No results found for: CHOL, TRIG, HDL, CHOLHDL, VLDL, LDLCALC No results found for: TSH  Therapeutic Level Labs: No results found for: LITHIUM No results found for: VALPROATE No components found for:  CBMZ  Current Medications: Current Outpatient Medications  Medication Sig Dispense Refill  . benazepril (LOTENSIN) 10 MG tablet Take 10 mg by mouth daily.    . busPIRone (BUSPAR) 15 MG tablet Take 1 tablet (15 mg total) by mouth 2 (two) times daily. 60 tablet 2  . venlafaxine XR (EFFEXOR-XR) 75 MG 24 hr capsule TAKE 3 CAPSULES(225 MG) BY MOUTH DAILY WITH BREAKFAST 90 capsule 2   No current facility-administered medications for this visit.      Musculoskeletal: Strength & Muscle Tone: within normal limits Gait & Station: normal Patient leans: N/A  Psychiatric Specialty Exam: ROS no headache, no chest pain, no shortness of breath, no vomiting  There were no vitals taken for this visit.There is no height or weight on file to calculate BMI.  General Appearance: Well Groomed  Eye Contact:  Good  Speech:  Normal Rate  Volume:  Normal  Mood:  Euthymic  Affect:  Appropriate and Full Range  Thought Process:  Linear and Descriptions of Associations: Intact  Orientation:  Full (Time, Place, and Person)  Thought Content: No hallucinations, no delusions   Suicidal Thoughts:  No no suicidal or self-injurious ideations, no homicidal ideations  Homicidal Thoughts:  No  Memory:  Recent and remote grossly intact  Judgement:  Good  Insight:  Present  Psychomotor Activity:  Normal  Concentration:  Concentration: Good and Attention Span: Good  Recall:  Good  Fund of Knowledge: Good  Language: Good  Akathisia:  Negative  Handed:  Right  AIMS (if indicated): No abnormal or involuntary movements noted or reported  Assets:  Communication Skills Desire for Improvement Resilience  ADL's:  Intact  Cognition: WNL  Sleep:  Good   Screenings: ECT-MADRS      ECT Treatment from 05/05/2015 in [facility]  MADRS Total Score  33    Mini-Mental     ECT Treatment from 05/05/2015 in [facility]  Total Score (max 30 points )  30       Assessment and Plan: 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  Tolerating medications well.  He has recently retired following a Administrator, [employer] Service as physician and at present seems satisfied and excited about upcoming projects, mainly relocating to live near his daughter. Tolerating medications well, will continue same medications (Effexor XR, BuSpar) at same doses. We will see in 3 months, does agree to contact clinic sooner if any worsening or concerns prior.   Craige Cotta, MD 08/14/2017, 12:04 PM

## 2017-11-20 ENCOUNTER — Ambulatory Visit (HOSPITAL_COMMUNITY): Payer: Self-pay | Admitting: Psychiatry

## 2017-12-11 ENCOUNTER — Ambulatory Visit (HOSPITAL_COMMUNITY): Payer: Self-pay | Admitting: Psychiatry

## 2017-12-12 ENCOUNTER — Ambulatory Visit (INDEPENDENT_AMBULATORY_CARE_PROVIDER_SITE_OTHER): Payer: Medicare Other | Admitting: Psychiatry

## 2017-12-12 ENCOUNTER — Encounter (HOSPITAL_COMMUNITY): Payer: Self-pay | Admitting: Psychiatry

## 2017-12-12 VITALS — BP 130/78 | HR 68 | Ht 67.0 in | Wt 173.0 lb

## 2017-12-12 DIAGNOSIS — F3341 Major depressive disorder, recurrent, in partial remission: Secondary | ICD-10-CM | POA: Diagnosis not present

## 2017-12-12 DIAGNOSIS — F1210 Cannabis abuse, uncomplicated: Secondary | ICD-10-CM

## 2017-12-12 DIAGNOSIS — Z87891 Personal history of nicotine dependence: Secondary | ICD-10-CM

## 2017-12-12 DIAGNOSIS — Z818 Family history of other mental and behavioral disorders: Secondary | ICD-10-CM | POA: Diagnosis not present

## 2017-12-13 NOTE — Progress Notes (Signed)
BH MD/PA/NP OP Progress Note  12/13/2017 4:16 PM Jose Perez  MRN:  [id number]  Chief Complaint: Patient presents with wife and adult daughter for requested appointment HPI: 67 year old male, retired Development worker, community.  History of major depression, social anxiety, cannabis abuse. I received a call from his wife a few days ago expressing concern that patient had relapsed on cannabis during a recent trip to [place] and requesting for appointment. In summary, patient and family traveled to [place] recently in order to attend his oldest son's engagement party.  During this trip he purchased and consumed cannabis.  His wife and adult daughter found out.  This has resulted in significant family turmoil. Family reports that when patient uses cannabis he tends to become more arrogant, "obnoxious", easily irritated, and expressed concern that he does not see these changes in himself.  They report recent arguments related to his relapse, and over recent days he has been staying at another home they own rather than at home with his wife. To summarize, patient reports that he feels cannabis helps him in the sense of helping him be less introverted and more sociable, with less social anxiety, and that this was his purpose in recent use.  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 Currently patient presents alert, attentive, and is not presenting with any current symptoms of mania or hypomania-no pressured speech, no overtly irritable or angry affect, no flight of ideations, no grandiose ideations, states sleeping well. He did gradually during session acknowledge that his cannabis use  significantly and negatively impacts family functioning and "maybe my own as well".  A portion of this visit was spent on family tried to encourage him to go to AA or NA which he has gone to in the past but not recently and patient preferring to go to [facility]. As far as medications, denies side effects and states that he feels current medications have been helpful (he has been on Effexor XR for many years). Patient denies any cannabis use since his return from [place] several days ago.  Visit Diagnosis: No diagnosis found.  Past Psychiatric History:   Past Medical History:  Past Medical History:  Diagnosis Date  . Cannabis dependence (HCC) 03/13/2014  . Depression   . Hypertension 05/19/14    Past Surgical History:  Procedure Laterality Date  . NO PAST SURGERIES      Family Psychiatric History:   Family History:  Family History  Problem Relation Age of Onset  . Depression Sister   . Schizophrenia Sister   . Heart attack Father     Social History:  Social History   Socioeconomic History  . Marital status: Married    Spouse name: Not on file  . Number of children: Not on file  . Years of education: Not on file  . Highest education level: Not on file  Occupational History  . Not on file  Social Needs  . Financial resource strain: Patient refused  . Food insecurity:    Worry: Patient refused    Inability: Patient refused  . Transportation needs:    Medical: Patient refused    Non-medical: Patient refused  Tobacco Use  . Smoking  status: Former Smoker    Packs/day: 0.50    Types: Cigarettes    Start date: 04/10/2016    Last attempt to quit: 04/10/2016    Years since quitting: 1.6  . Smokeless tobacco: Former Engineer, water and Sexual Activity  . Alcohol use: No    Alcohol/week: 0.0 standard drinks  . Drug use: No    Types: Marijuana    Comment: last used about a month and half ago  . Sexual activity: Yes    Partners: Female    Birth  control/protection: None  Lifestyle  . Physical activity:    Days per week: Patient refused    Minutes per session: Patient refused  . Stress: Patient refused  Relationships  . Social connections:    Talks on phone: Patient refused    Gets together: Patient refused    Attends religious service: Patient refused    Active member of club or organization: Patient refused    Attends meetings of clubs or organizations: Patient refused    Relationship status: Patient refused  Other Topics Concern  . Not on file  Social History Narrative  . Not on file    Allergies: No Known Allergies  Metabolic Disorder Labs: No results found for: HGBA1C, MPG No results found for: PROLACTIN No results found for: CHOL, TRIG, HDL, CHOLHDL, VLDL, LDLCALC No results found for: TSH  Therapeutic Level Labs: No results found for: LITHIUM No results found for: VALPROATE No components found for:  CBMZ  Current Medications: Current Outpatient Medications  Medication Sig Dispense Refill  . benazepril (LOTENSIN) 10 MG tablet Take 10 mg by mouth daily.    . busPIRone (BUSPAR) 15 MG tablet Take 1 tablet (15 mg total) by mouth 2 (two) times daily. 60 tablet 2  . venlafaxine XR (EFFEXOR-XR) 75 MG 24 hr capsule TAKE 3 CAPSULES(225 MG) BY MOUTH DAILY WITH BREAKFAST 90 capsule 2  . rosuvastatin (CRESTOR) 5 MG tablet   1   No current facility-administered medications for this visit.      Musculoskeletal: Strength & Muscle Tone: within normal limits Gait & Station: normal Patient leans: N/A  Psychiatric Specialty Exam: ROS  Blood pressure 130/78, pulse 68, height 5\' 7"  (1.702 m), weight 78.5 kg.Body mass index is 27.1 kg/m.  General Appearance: Well Groomed  Eye Contact:  Good  Speech:  Normal Rate  Volume:  Normal  Mood:  currently denies depression, states " my mood is "OK"  Affect:  Slightly constricted but improves during session, smiles, even laughs appropriately during session  Thought Process:   Linear and Descriptions of Associations: Intact  Orientation:  Full (Time, Place, and Person)  Thought Content: No hallucinations, no delusions   Suicidal Thoughts:  No he denies any suicidal or self-injurious ideations  Homicidal Thoughts:  No he denies any homicidal or violent ideations  Memory:  Recent and remote grossly intact  Judgement:  Fair  Insight:  Fair-specifically pertaining to negative impact that cannabis use has had on his functioning and his family's  Psychomotor Activity:  Normal  Concentration:  Concentration: Good and Attention Span: Good  Recall:  Good  Fund of Knowledge: Good  Language: Good  Akathisia:  Negative  Handed:  Right  AIMS (if indicated):  AIMS testing not done  Assets:  Communication Skills Housing Resilience  ADL's:  Intact  Cognition: WNL  Sleep:  Good   Screenings: ECT-MADRS     ECT Treatment from 05/05/2015 in 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  Family requested appointment today.  After a period of sobriety/abstinence patient relapsed on cannabis during a recent trip to [place] to visit his son.  Family reports that when patient uses cannabis he becomes irascible, "arrogant", irritable.  He denies this and states he feels cannabis helps him become less socially withdrawn/anxious (has history of social anxiety).  At this time he reports he has been abstinent from cannabis for several days and he is not currently presenting with symptoms suggestive of mania or hypomania.  He also denies current depression or any suicidal thoughts.  His wife and his daughter do state that during episodes of sustained sobriety (patient has had periods of several years of sobriety out of stretch) his overall mood is much more stable and his  relatedness significantly improved.  Family wants him to attend 12-step program which he eventually agreed to. Essentially, this session was focused on family concern and "intervention" regarding recent substance abuse/relapse.  I facilitated  meeting and reviewed with patient the negative impact that cannabis abuse has had not only for him but for family functioning and encouraged efforts to work on abstinence/sobriety. No medication changes at this time, no medications prescribed by me at this time. We will see him again in 1 to 2 weeks, agrees to contact me sooner should to be any worsening prior.   Craige Cotta, MD 12/13/2017, 4:16 PM

## 2017-12-18 ENCOUNTER — Ambulatory Visit (INDEPENDENT_AMBULATORY_CARE_PROVIDER_SITE_OTHER): Payer: Medicare Other | Admitting: Psychiatry

## 2017-12-18 ENCOUNTER — Ambulatory Visit (HOSPITAL_COMMUNITY): Payer: Self-pay | Admitting: Psychiatry

## 2017-12-18 ENCOUNTER — Encounter (HOSPITAL_COMMUNITY): Payer: Self-pay | Admitting: Psychiatry

## 2017-12-18 DIAGNOSIS — F332 Major depressive disorder, recurrent severe without psychotic features: Secondary | ICD-10-CM | POA: Diagnosis not present

## 2017-12-18 DIAGNOSIS — F1290 Cannabis use, unspecified, uncomplicated: Secondary | ICD-10-CM | POA: Diagnosis not present

## 2017-12-18 DIAGNOSIS — Z818 Family history of other mental and behavioral disorders: Secondary | ICD-10-CM

## 2017-12-18 DIAGNOSIS — Z87891 Personal history of nicotine dependence: Secondary | ICD-10-CM | POA: Diagnosis not present

## 2017-12-18 MED ORDER — VENLAFAXINE HCL ER 75 MG PO CP24: ORAL_CAPSULE | ORAL | 2 refills | Status: DC

## 2017-12-18 MED ORDER — BUSPIRONE HCL 15 MG PO TABS: 15.0000 mg | ORAL_TABLET | Freq: Two times a day (BID) | ORAL | 2 refills | Status: DC

## 2017-12-18 NOTE — Addendum Note (Signed)
Addended by: Nehemiah Massed A on: 12/18/2017 03:47 PM   Modules accepted: Level of Service

## 2017-12-18 NOTE — Progress Notes (Signed)
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  History of major depression, history of Cannabis Use Disorder. Please refer to prior note for further details-in summary, patient recently relapsed on cannabis during a family trip to [place].  This resulted in significant family turmoil.  Both his wife and his adult daughter stated that cannabis causes him to become "different", with a tendency to be more irritable, irascible, angry, entitled.  Patient has stated he experiences cannabis differently, he states he feels that it helps him feel less inhibited, more sociable. (He does have a history of social anxiety symptoms). Today he presents with his wife.  Situation now significantly improved.  At this time presents euthymic, pleasant, with a full range of affect.  Wife corroborates that seems "his usual self". Their relationship has been tense following recent relapse but is now improved. We have reviewed previous history-patient has a long history of cannabis abuse, but had been sober for years.  He had a similar episode 2 years ago after relapsing on cannabis. There is no clear history of bipolarity, other than above reported behavioral changes when under the influence of cannabis.  Regarding medications, he has responded well to Effexor XR/BuSpar which he has been on for years. Currently patient is presenting with improving insight regarding the negative impact that cannabis has had on his functioning and on his family relationships.  He states he has no intention of continuing to use this substance.  We have also reviewed how can this can sometimes be laced , unbeknownst to the user ,with other drugs that can cause severe psychiatric symptoms.  His wife has encouraged him to attend NA-patient reports he is considering this.  He does state he is  very interested in attending a [employer] retreat, to help him "get things in perspective" Visit Diagnosis:    ICD-10-CM   1. 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 Cannabis dependence (HCC) 03/13/2014  . Depression   . Hypertension 05/19/14    Past Surgical History:  Procedure Laterality Date  . NO PAST SURGERIES      Family Psychiatric History:   Family History:  Family History  Problem Relation Age of Onset  . Depression Sister   . Schizophrenia Sister   . Heart attack Father     Social History:  Social History   Socioeconomic History  . Marital status: Married    Spouse name: Not on file  . Number of children: Not on file  . Years of education: Not on file  . Highest education level: Not on file  Occupational History  . Not on file  Social Needs  . Financial resource strain: Patient refused  . Food insecurity:    Worry: Patient refused    Inability: Patient refused  . Transportation needs:    Medical: Patient refused    Non-medical: Patient refused  Tobacco Use  . Smoking status: Former Smoker    Packs/day: 0.50    Types: Cigarettes    Start date: 04/10/2016    Last attempt to quit: 04/10/2016    Years since quitting: 1.6  . Smokeless tobacco: Former Engineer, water and Sexual  Activity  . Alcohol use: No    Alcohol/week: 0.0 standard drinks  . Drug use: No    Types: Marijuana    Comment: last used about a month and half ago  . Sexual activity: Yes    Partners: Female    Birth control/protection: None  Lifestyle  . Physical activity:    Days per week: Patient refused    Minutes per session: Patient refused  . Stress: Patient refused  Relationships  . Social connections:    Talks on phone: Patient refused    Gets together:  Patient refused    Attends religious service: Patient refused    Active member of club or organization: Patient refused    Attends meetings of clubs or organizations: Patient refused    Relationship status: Patient refused  Other Topics Concern  . Not on file  Social History Narrative  . Not on file    Allergies: No Known Allergies  Metabolic Disorder Labs: No results found for: HGBA1C, MPG No results found for: PROLACTIN No results found for: CHOL, TRIG, HDL, CHOLHDL, VLDL, LDLCALC No results found for: TSH  Therapeutic Level Labs: No results found for: LITHIUM No results found for: VALPROATE No components found for:  CBMZ  Current Medications: Current Outpatient Medications  Medication Sig Dispense Refill  . benazepril (LOTENSIN) 10 MG tablet Take 10 mg by mouth daily.    . busPIRone (BUSPAR) 15 MG tablet Take 1 tablet (15 mg total) by mouth 2 (two) times daily. 60 tablet 2  . rosuvastatin (CRESTOR) 5 MG tablet   1  . venlafaxine XR (EFFEXOR-XR) 75 MG 24 hr capsule TAKE 3 CAPSULES(225 MG) BY MOUTH DAILY WITH BREAKFAST 90 capsule 2   No current facility-administered medications for this visit.      Musculoskeletal: Strength & Muscle Tone: within normal limits Gait & Station: normal Patient leans: N/A  Psychiatric Specialty Exam: ROS-no chest pain, no shortness of breath, no vomiting  Blood pressure 124/70, pulse 80, height 5\' 7"  (1.702 m), weight 172 lb (78 kg).Body mass index is 26.94 kg/m.  General Appearance: Well Groomed  Eye Contact:  Good  Speech:  Normal Rate  Volume:  Normal  Mood:  Euthymic  Affect:  Blunt and Full Range  Thought Process:  Linear and Descriptions of Associations: Intact  Orientation:  Full (Time, Place, and Person)  Thought Content: No hallucinations, no delusions   Suicidal Thoughts:  No  Homicidal Thoughts:  No  Memory:  Recent and remote grossly intact  Judgement:  Other:  Improving  Insight:  Fair and Improving  Psychomotor  Activity:  Normal  Concentration:  Concentration: Good and Attention Span: Good  Recall:  Good  Fund of Knowledge: Good  Language: Good  Akathisia:  Negative  Handed:  Right  AIMS (if indicated): No abnormal or involuntary movements noted or endorsed  Assets:  Financial Resources/Insurance Housing Resilience Social Support  ADL's:  Intact  Cognition: WNL  Sleep:  Good   Screenings: ECT-MADRS     ECT Treatment from [facility]/05/2015 in [facility]  MADRS Total Score  33    Mini-Mental     ECT Treatment from 05/05/2015 in [facility]  Total Score (max 30 points )  30       Assessment and Plan: 67 year old male, history of major depression, history of cannabis abuse.  Recent episode of increased irritability and family tension in the context of relapse on cannabis.  At this time  patient reports abstinence and presents stable, euthymic, with a full range of affect, pleasant, without symptoms of hypomania or mania or irritability.  Wife corroborates that patient seems much improved and back to his baseline.  Patient has had at least one prior similar episode (developing irritability, anger, impulsivity under the influence of cannabis) 3 years ago.  We have reviewed the above and potential for this substance to induce psychiatric symptoms.  Patient does present with improving insight and currently describing intention to remain abstinent.  Regarding history of major depression he is currently euthymic and has been stable on Effexor XR which she is tolerating well. Continue Effexor XR 225 mg daily , continue BuSpar 15 mg twice daily for anxiety We will see patient again in 3 to 4 weeks agrees to contact me sooner should to be any worsening or concerns prior   Craige Cotta, MD 12/18/2017, 3:11 PM

## 2018-01-29 ENCOUNTER — Encounter (HOSPITAL_COMMUNITY): Payer: Self-pay | Admitting: Psychiatry

## 2018-01-29 ENCOUNTER — Encounter

## 2018-01-29 ENCOUNTER — Ambulatory Visit (INDEPENDENT_AMBULATORY_CARE_PROVIDER_SITE_OTHER): Payer: Medicare Other | Admitting: Psychiatry

## 2018-01-29 VITALS — BP 120/72 | Ht 66.0 in | Wt 176.0 lb

## 2018-01-29 DIAGNOSIS — F1210 Cannabis abuse, uncomplicated: Secondary | ICD-10-CM

## 2018-01-29 DIAGNOSIS — Z23 Encounter for immunization: Secondary | ICD-10-CM | POA: Diagnosis not present

## 2018-01-29 DIAGNOSIS — F3342 Major depressive disorder, recurrent, in full remission: Secondary | ICD-10-CM

## 2018-01-29 NOTE — Progress Notes (Signed)
BH MD/PA/NP OP Progress Note  01/29/2018 3:19 PM Jose Perez  MRN:  [id number]  Chief Complaint: patient returns for medication management appointment  HPI:  Patient reports he has been doing well, and that he is currently stable. He has remained abstinent/ no relapses. His wife, who comes with him to appointments, corroborates that he has been doing well recently . They both report that in the context of his sobriety and improved symptoms their relationship has improved significantly and that it is currently " a lot better" , with less family conflict, and no longer separated ( they had been living separately for a brief period of time). Patient and wife had expressed concern that he might have a recurrence of depression as has happened in the past following episodes of cannabis use. He denies any current depression, presents euthymic, and affect is full in range, no neuro-vegetative symptoms reported. He is future oriented and he is looking forward to a planned vacation with family. He does continue to face some stressors- his youngest daughter ( now 27) has history of binge drinking and depression, anxiety, he is involved in some litigation, but he reports he is handling these stressors well.  He is tolerating Effexor XR and Buspar well , without side effects.  Visit Diagnosis: MDD, Cannabis Abuse Past Psychiatric History:   Past Medical History:  Past Medical History:  Diagnosis Date  . Cannabis dependence (HCC) 03/13/2014  . Depression   . Hypertension 05/19/14    Past Surgical History:  Procedure Laterality Date  . NO PAST SURGERIES      Family Psychiatric History:   Family History:  Family History  Problem Relation Age of Onset  . Depression Sister   . Schizophrenia Sister   . Heart attack Father     Social History:  Social History   Socioeconomic History  . Marital status: Married    Spouse name: Not on file  . Number of children: Not on file  . Years of  education: Not on file  . Highest education level: Not on file  Occupational History  . Not on file  Social Needs  . Financial resource strain: Patient refused  . Food insecurity:    Worry: Patient refused    Inability: Patient refused  . Transportation needs:    Medical: Patient refused    Non-medical: Patient refused  Tobacco Use  . Smoking status: Former Smoker    Packs/day: 0.50    Types: Cigarettes    Start date: 04/10/2016    Last attempt to quit: 04/10/2016    Years since quitting: 1.8  . Smokeless tobacco: Former Engineer, water and Sexual Activity  . Alcohol use: No    Alcohol/week: 0.0 standard drinks  . Drug use: No    Types: Marijuana    Comment: last used about a month and half ago  . Sexual activity: Yes    Partners: Female    Birth control/protection: None  Lifestyle  . Physical activity:    Days per week: Patient refused    Minutes per session: Patient refused  . Stress: Patient refused  Relationships  . Social connections:    Talks on phone: Patient refused    Gets together: Patient refused    Attends religious service: Patient refused    Active member of club or organization: Patient refused    Attends meetings of clubs or organizations: Patient refused    Relationship status: Patient refused  Other Topics Concern  . Not on file  Social History Narrative  . Not on file    Allergies: No Known Allergies  Metabolic Disorder Labs: No results found for: HGBA1C, MPG No results found for: PROLACTIN No results found for: CHOL, TRIG, HDL, CHOLHDL, VLDL, LDLCALC No results found for: TSH  Therapeutic Level Labs: No results found for: LITHIUM No results found for: VALPROATE No components found for:  CBMZ  Current Medications: Current Outpatient Medications  Medication Sig Dispense Refill  . benazepril (LOTENSIN) 10 MG tablet Take 10 mg by mouth daily.    . busPIRone (BUSPAR) 15 MG tablet Take 1 tablet (15 mg total) by mouth 2 (two) times daily. 60  tablet 2  . rosuvastatin (CRESTOR) 5 MG tablet   1  . venlafaxine XR (EFFEXOR-XR) 75 MG 24 hr capsule TAKE 3 CAPSULES(225 MG) BY MOUTH DAILY WITH BREAKFAST 90 capsule 2   No current facility-administered medications for this visit.      Musculoskeletal: Strength & Muscle Tone: within normal limits Gait & Station: normal Patient leans: N/A  Psychiatric Specialty Exam: ROS  No symptoms endorsed   Blood pressure 120/72, height 5\' 6"  (1.676 m), weight 79.8 kg.Body mass index is 28.41 kg/m.  General Appearance: Well Groomed  Eye Contact:  Good  Speech:  Normal Rate  Volume:  Normal  Mood:  Euthymic  Affect:  Appropriate and Full Range  Thought Process:  Linear and Descriptions of Associations: Intact  Orientation:  Full (Time, Place, and Person)  Thought Content: no hallucinations, no delusions    Suicidal Thoughts:  No  Denies suicidal or self injurious ideations  Homicidal Thoughts:  No  Memory:  recent and remote grossly intact   Judgement:  Good  Insight:  Good  Psychomotor Activity:  Normal  Concentration:  Concentration: Good and Attention Span: Good  Recall:  Good  Fund of Knowledge: Good  Language: Good  Akathisia:  Negative  Handed:  Right  AIMS (if indicated): AIMS test not done today  Assets:  Communication Skills Desire for Improvement Resilience Social Support  ADL's:  Intact  Cognition: WNL  Sleep:  Good   Screenings: ECT-MADRS     ECT Treatment from 05/05/2015 in [facility]  MADRS Total Score  33    Mini-Mental     ECT Treatment from 05/05/2015 in [facility]  Total Score (max 30 points )  30       Assessment and Plan: Currently he is doing well,has remained abstinent from cannabis,   presents euthymic, with full range of affect, future oriented, looking forward to a planned family trip. Main stressor at this time relates to some tension with one of his adult daughters.Wife , who  comes with him to appointments, corroborates that he is currently doing well and that marital/family dynamics have improved considerably in the context of his improvement.  He is tolerating medications well , no  side effects. Will continue Effexor XR 225 mgrs QDAY and Buspar 15 mgrs BID- does not currently need script  Will see in two months, agrees to contact clinic sooner if any worsening prior   Craige Cotta, MD 01/29/2018, 3:19 PM

## 2018-03-26 ENCOUNTER — Ambulatory Visit (HOSPITAL_COMMUNITY): Payer: Medicare Other | Admitting: Psychiatry

## 2018-04-04 DIAGNOSIS — Z125 Encounter for screening for malignant neoplasm of prostate: Secondary | ICD-10-CM | POA: Diagnosis not present

## 2018-04-04 DIAGNOSIS — R351 Nocturia: Secondary | ICD-10-CM | POA: Diagnosis not present

## 2018-04-04 DIAGNOSIS — N401 Enlarged prostate with lower urinary tract symptoms: Secondary | ICD-10-CM | POA: Diagnosis not present

## 2018-04-04 DIAGNOSIS — I10 Essential (primary) hypertension: Secondary | ICD-10-CM | POA: Diagnosis not present

## 2018-04-04 DIAGNOSIS — F3341 Major depressive disorder, recurrent, in partial remission: Secondary | ICD-10-CM | POA: Diagnosis not present

## 2018-04-04 DIAGNOSIS — E782 Mixed hyperlipidemia: Secondary | ICD-10-CM | POA: Diagnosis not present

## 2018-04-04 DIAGNOSIS — E538 Deficiency of other specified B group vitamins: Secondary | ICD-10-CM | POA: Diagnosis not present

## 2018-04-04 DIAGNOSIS — Z6828 Body mass index (BMI) 28.0-28.9, adult: Secondary | ICD-10-CM | POA: Diagnosis not present

## 2018-04-04 DIAGNOSIS — Z87891 Personal history of nicotine dependence: Secondary | ICD-10-CM | POA: Diagnosis not present

## 2018-04-04 DIAGNOSIS — Z23 Encounter for immunization: Secondary | ICD-10-CM | POA: Diagnosis not present

## 2018-04-04 DIAGNOSIS — E559 Vitamin D deficiency, unspecified: Secondary | ICD-10-CM | POA: Diagnosis not present

## 2018-04-04 DIAGNOSIS — R635 Abnormal weight gain: Secondary | ICD-10-CM | POA: Diagnosis not present

## 2018-04-05 ENCOUNTER — Other Ambulatory Visit (HOSPITAL_COMMUNITY): Payer: Self-pay | Admitting: Psychiatry

## 2018-04-05 DIAGNOSIS — F332 Major depressive disorder, recurrent severe without psychotic features: Secondary | ICD-10-CM

## 2018-04-08 ENCOUNTER — Other Ambulatory Visit (HOSPITAL_COMMUNITY): Payer: Self-pay

## 2018-04-08 DIAGNOSIS — F332 Major depressive disorder, recurrent severe without psychotic features: Secondary | ICD-10-CM

## 2018-04-08 MED ORDER — VENLAFAXINE HCL ER 75 MG PO CP24: ORAL_CAPSULE | ORAL | 2 refills | Status: DC

## 2018-04-09 ENCOUNTER — Ambulatory Visit (HOSPITAL_COMMUNITY): Payer: Medicare Other | Admitting: Psychiatry

## 2018-04-15 ENCOUNTER — Ambulatory Visit (HOSPITAL_COMMUNITY): Payer: Medicare Other | Admitting: Psychiatry

## 2018-05-07 ENCOUNTER — Ambulatory Visit (HOSPITAL_COMMUNITY): Payer: Medicare Other | Admitting: Psychiatry

## 2018-05-13 ENCOUNTER — Ambulatory Visit (INDEPENDENT_AMBULATORY_CARE_PROVIDER_SITE_OTHER): Payer: Medicare Other | Admitting: Psychiatry

## 2018-05-13 ENCOUNTER — Encounter (HOSPITAL_COMMUNITY): Payer: Self-pay | Admitting: Psychiatry

## 2018-05-13 DIAGNOSIS — F332 Major depressive disorder, recurrent severe without psychotic features: Secondary | ICD-10-CM | POA: Diagnosis not present

## 2018-05-13 DIAGNOSIS — F1221 Cannabis dependence, in remission: Secondary | ICD-10-CM

## 2018-05-13 MED ORDER — VENLAFAXINE HCL ER 75 MG PO CP24: ORAL_CAPSULE | ORAL | 2 refills | Status: DC

## 2018-05-13 MED ORDER — BUSPIRONE HCL 15 MG PO TABS: 15.0000 mg | ORAL_TABLET | Freq: Two times a day (BID) | ORAL | 2 refills | Status: DC

## 2018-05-13 NOTE — Progress Notes (Signed)
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  He has a history of MDD and cannabis use disorder.  Currently he is not depressed and presents euthymic, with a full range of affect, does not endorse any significant neurovegetative symptoms of depression and denies any suicidal ideations.  (Sleep, appetite, energy level within normal, no anhedonia, no persistent/pervasive sadness, no suicidal ideations). Patient has been facing some significant stressors recently.  His brother, who lives in [place] and who has a history of alcohol abuse relapsed recently and was severely ill following withdrawal seizures , possible aspiration, requiring ICU treatment.  His youngest daughter, currently in her early 39s, also has a history of alcohol abuse, and was recently charged with a DUI and briefly detained overnight.  Patient did go to visit his brother in [place] due to above,  at which time he relapsed on cannabis x1 time.  In the past cannabis abuse/relapses have resulted in noticeable changes in behavior noticed by his wife, but on this occasion she has not noticed any appreciable change in his behavior and patient currently presents euthymic.  Past episodes of cannabis abuse have resulted in significant family turmoil, and patient has presented more irritable, dysphoric, and prone to anger during past episodes of persistent cannabis use.  He does have insight regarding the negative impact that prior cannabis use has had on his behavior and on family relationships, and states he is motivated in sobriety.  We reviewed recent relapse, which she states was impulsive, unplanned, and occurred when cleaning up his brother's home and finding cannabis there.  Regarding medications, he is on  BuSpar and on Effexor XR which she is tolerating well and which he states have been very effective to address his depression and anxiety symptoms.   Visit Diagnosis:    ICD-10-CM   1. 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 Cannabis dependence (HCC) 03/13/2014  . Depression   . Hypertension 05/19/14    Past Surgical History:  Procedure Laterality Date  . NO PAST SURGERIES      Family Psychiatric History:   Family History:  Family History  Problem Relation Age of Onset  . Depression Sister   . Schizophrenia Sister   . Heart attack Father     Social History:  Social History   Socioeconomic History  . Marital status: Married    Spouse name: Not on file  . Number of children: Not on file  . Years of education: Not on file  . Highest education level: Not on file  Occupational History  . Not on file  Social Needs  . Financial resource strain: Patient refused  . Food insecurity:    Worry: Patient refused    Inability: Patient refused  . Transportation needs:    Medical: Patient refused    Non-medical: Patient refused  Tobacco Use  . Smoking status: Former Smoker    Packs/day: 0.50    Types: Cigarettes    Start date: 04/10/2016    Last attempt to quit: 04/10/2016    Years since quitting: 2.0  . Smokeless tobacco:  Former NeurosurgeonUser  Substance and Sexual Activity  . Alcohol use: No    Alcohol/week: 0.0 standard drinks  . Drug use: No    Types: Marijuana    Comment: last used about a month and half ago  . Sexual activity: Yes    Partners: Female    Birth control/protection: None  Lifestyle  . Physical activity:    Days per week: Patient refused    Minutes per session: Patient refused  . Stress: Patient refused  Relationships  . Social  connections:    Talks on phone: Patient refused    Gets together: Patient refused    Attends religious service: Patient refused    Active member of club or organization: Patient refused    Attends meetings of clubs or organizations: Patient refused    Relationship status: Patient refused  Other Topics Concern  . Not on file  Social History Narrative  . Not on file    Allergies: No Known Allergies  Metabolic Disorder Labs: No results found for: HGBA1C, MPG No results found for: PROLACTIN No results found for: CHOL, TRIG, HDL, CHOLHDL, VLDL, LDLCALC No results found for: TSH  Therapeutic Level Labs: No results found for: LITHIUM No results found for: VALPROATE No components found for:  CBMZ  Current Medications: Current Outpatient Medications  Medication Sig Dispense Refill  . benazepril (LOTENSIN) 10 MG tablet Take 10 mg by mouth daily.    . busPIRone (BUSPAR) 15 MG tablet Take 1 tablet (15 mg total) by mouth 2 (two) times daily. 60 tablet 2  . rosuvastatin (CRESTOR) 5 MG tablet   1  . venlafaxine XR (EFFEXOR-XR) 75 MG 24 hr capsule TAKE 3 CAPSULES(225 MG) BY MOUTH DAILY WITH BREAKFAST 90 capsule 2   No current facility-administered medications for this visit.      Musculoskeletal: Strength & Muscle Tone: within normal limits Gait & Station: normal Patient leans: N/A  Psychiatric Specialty Exam: ROS no chest pain, no shortness of breath, no vomiting  There were no vitals taken for this visit.There is no height or weight on file to calculate BMI.  General Appearance: Well Groomed  Eye Contact:  Good  Speech:  Normal Rate  Volume:  Normal  Mood:  Euthymic  Affect:  Appropriate and Full Range  Thought Process:  Linear and Descriptions of Associations: Intact  Orientation:  Full (Time, Place, and Person)  Thought Content: No hallucinations, no delusions   Suicidal Thoughts:  No-denies suicidal or self-injurious ideations  Homicidal Thoughts:  No  Memory:  Recent  and remote grossly intact  Judgement:  Fair  Insight:  Fair  Psychomotor Activity:  Normal  Concentration:  Concentration: Good and Attention Span: Good  Recall:  Good  Fund of Knowledge: Good  Language: Good  Akathisia:  Negative  Handed:  Right  AIMS (if indicated): No abnormal or involuntary movements noted or reported  Assets:  Communication Skills Desire for Improvement Resilience  ADL's:  Intact  Cognition: WNL  Sleep:  Good   Screenings: ECT-MADRS     [facility]  MADRS Total Score  33    Mini-Mental     [facility]  Total Score (max 30 points )  30       Assessment and Plan:  68 year old male, retired MD, presents for gradual med management and support.  He comes to appointments with his wife, Jose Batheresa, who provides collateral information.  He has a history of major depression-currently he is doing well and presents euthymic with a full range of affect and no significant neurovegetative symptoms.  He also has a history of cannabis abuse, and recently relapsed briefly during a trip to [place] to visit his medically ill brother.  Cannabis relapses have tended to cause family/marital tension, as there is a strong history of substance abuse in the family, and as wife perceives that patient's behavior and overall demeanor changes for the worse when he is using cannabis. Patient does acknowledge the above, but tends to minimize recent relapse, which she describes as 1 time use only.  He is tolerating Effexor XR and BuSpar well, without side effects, and with good clinical response. We have, using nonconfrontational/motivational interviewing strategies reviewed the negative impact of illicit drug use on level of functioning and on family dynamics and have encouraged patient to consider full abstinence as a treatment goal.  We will see in 2 months-patient  agrees to contact me sooner should to be any worsening prior. Continue current medications, no dosing or medication changes made at this time.   Craige Cotta, MD 05/13/2018, 1:27 PM

## 2018-07-26 ENCOUNTER — Encounter: Payer: Self-pay | Admitting: Psychiatry

## 2018-07-26 NOTE — Progress Notes (Unsigned)
Patient ID: Jose Perez, male   DOB: [birth year], 68 y.o.   MRN: [id number] 07/26/2018 at 1,15 PM  Phone contact documentation  Patient's wife, Jose Perez, called me this AM to inform me patient has relapsed on cannabis and has used cannabis on several occasions recently. She states that when he relapses on cannabis he generally becomes more irritable and " unpleasant to be around with", which is negatively affecting his relationship with her and with his adult children. Denies any physical abuse . Currently she does not feel there is any psychiatric emergency, and is aware she can seek commitment if she has concerns about his/her safety.  She requested appointment for him to follow up with me: appt on 4/27 .  I have attempted to contact patient at available phone number x 2 - no answer .  Sallyanne Havers MD

## 2018-08-05 ENCOUNTER — Other Ambulatory Visit: Payer: Self-pay

## 2018-08-05 ENCOUNTER — Encounter (HOSPITAL_COMMUNITY): Payer: Self-pay | Admitting: Psychiatry

## 2018-08-05 ENCOUNTER — Ambulatory Visit (INDEPENDENT_AMBULATORY_CARE_PROVIDER_SITE_OTHER): Payer: Medicare Other | Admitting: Psychiatry

## 2018-08-05 DIAGNOSIS — F1210 Cannabis abuse, uncomplicated: Secondary | ICD-10-CM | POA: Diagnosis not present

## 2018-08-05 DIAGNOSIS — F3340 Major depressive disorder, recurrent, in remission, unspecified: Secondary | ICD-10-CM | POA: Diagnosis not present

## 2018-08-05 NOTE — Progress Notes (Signed)
BH MD/PA/NP OP Progress Note (Via phone communication) 08/05/2018 12:10 PM Jose Perez  MRN:  [id number]  Chief Complaint:    Medication management appointment HPI: Patient assessed via phone today. 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  Patient reports he has been doing well . His wife , who is present during session via phone, disagrees and states he has relapsed / been smoking cannabis. Reportedly he had relapsed last year , and has used " on and off " over the last several months. Patient acknowledges, states that he last used cannabis about two weeks ago after his wife found it and threw it out. Denies alcohol or other drug use.  She agrees and does not think he is currently using.  As has been discussed /reviewed in the past, wife reports that when patient smokes cannabis he tends to become more irritable/ confrontational , more easily angered, less empathic, which leads to increased tension between them and between him and his adult children, and states that his children have been minimizing contact with him and that she has moved to another house they own. At this time , he denies untoward effects and states cannabis helps him feel more " relaxed" and more creative, and that his irritability is related not to the substance itself but to his family " telling me I am an addict and that I need to go to meetings ".  Currently he denies feeling irritable or angry, denies insomnia/ states he has been sleeping well , denies increased impulsivity or reckless behaviors, denies any violent behaviors . He does not present with pressured speech and (on phone)no overt  Irritability, anger or expansive affect is noted . He states he has been practicing social distancing and sheltering in place, speaking with his brother on the phone, following the news and his savings/portfolio and working around the new home . He denies medication side effects.  He denies feeling  depressed and denies any suicidal ideations, denies homicidal or violent ideations.  Visit Diagnosis: MDD, Cannabis Use Disorder Past Psychiatric History:   Past Medical History:  Past Medical History:  Diagnosis Date  . Cannabis dependence (HCC) 03/13/2014  . Depression   . Hypertension 05/19/14    Past Surgical History:  Procedure Laterality Date  . NO PAST SURGERIES      Family Psychiatric History:   Family History:  Family History  Problem Relation Age of Onset  . Depression Sister   . Schizophrenia Sister   . Heart attack Father     Social History:  Social History   Socioeconomic History  . Marital status: Married    Spouse name: Not on file  . Number of children: Not on file  . Years of education: Not on file  . Highest education level: Not on file  Occupational History  . Not on file  Social Needs  . Financial resource strain: Patient refused  . Food insecurity:    Worry: Patient refused    Inability: Patient refused  . Transportation needs:    Medical: Patient refused    Non-medical: Patient refused  Tobacco Use  . Smoking status: Former Smoker    Packs/day: 0.50    Types: Cigarettes    Start date: 04/10/2016    Last attempt to quit: 04/10/2016    Years since quitting: 2.3  . Smokeless tobacco: Former Engineer, waterUser  Substance and Sexual Activity  . Alcohol use: No    Alcohol/week: 0.0 standard drinks  .  Drug use: No    Types: Marijuana    Comment: last used about a month and half ago  . Sexual activity: Yes    Partners: Female    Birth control/protection: None  Lifestyle  . Physical activity:    Days per week: Patient refused    Minutes per session: Patient refused  . Stress: Patient refused  Relationships  . Social connections:    Talks on phone: Patient refused    Gets together: Patient refused    Attends religious service: Patient refused    Active member of club or organization: Patient refused    Attends meetings of clubs or organizations:  Patient refused    Relationship status: Patient refused  Other Topics Concern  . Not on file  Social History Narrative  . Not on file    Allergies: No Known Allergies  Metabolic Disorder Labs: No results found for: HGBA1C, MPG No results found for: PROLACTIN No results found for: CHOL, TRIG, HDL, CHOLHDL, VLDL, LDLCALC No results found for: TSH  Therapeutic Level Labs: No results found for: LITHIUM No results found for: VALPROATE No components found for:  CBMZ  Current Medications: Current Outpatient Medications  Medication Sig Dispense Refill  . benazepril (LOTENSIN) 10 MG tablet Take 10 mg by mouth daily.    . busPIRone (BUSPAR) 15 MG tablet Take 1 tablet (15 mg total) by mouth 2 (two) times daily. 60 tablet 2  . rosuvastatin (CRESTOR) 5 MG tablet   1  . venlafaxine XR (EFFEXOR-XR) 75 MG 24 hr capsule TAKE 3 CAPSULES(225 MG) BY MOUTH DAILY WITH BREAKFAST 90 capsule 2   No current facility-administered medications for this visit.      NA  Psychiatric Specialty Exam: please take into account limitations inherent to phone communication ROS no shortness of breath, no coughing  There were no vitals taken for this visit.There is no height or weight on file to calculate BMI.  General Appearance: NA  Eye Contact:  NA  Speech:  Normal Rate- no pressured or loud speech  Volume:  Normal  Mood:  patient reports his mood as "OK", denies depression, denies feeilng angry or overly irritable   Affect:  currently no expansiveness or obvious irritability noted   Thought Process:  Goal Directed, Linear and Descriptions of Associations: Intact  Orientation:  Full (Time, Place, and Person)  Thought Content: denies hallucinations, no delusions expressed    Suicidal Thoughts:  No denies suicidal or self injurious ideations, denies HI  Homicidal Thoughts:  No  Memory:  recent and remote grossly intact   Judgement:  Other:  present  Insight:  Fair  Psychomotor Activity:  NA   Concentration:  Concentration: Good and Attention Span: Good  Recall:  Good  Fund of Knowledge: Good  Language: Good  Akathisia:  NA  Handed:  Right  AIMS (if indicated): AIMS test not done   Assets:  Architect Housing Resilience  ADL's:  Intact  Cognition: WNL  Sleep:  states he has been sleeping well    Screenings: ECT-MADRS     [facility]  MADRS Total Score  33    Mini-Mental     ECT Treatment from 05/05/2015 [facility]  Total Score (max 30 points )  30       Assessment and Plan: patient assessed via phone. He reports he has been stable and is doing well at this time. Denies  depression, neuro-vegetative symptoms or any SI. Denies medication side effects. Wife, who usually participates in these sessions , reports patient has relapsed on cannabis and has been smoking episodically . States that when he uses cannabis he becomes more irritable, easily angered leading to increased family tension. Currently patient denies untoward effects from cannabis, and states irritability/anger relates not to cannabis but to his family " calling me an addict and telling me what I need to do and to go to meetings ". Patient currently does not endorse or present with overt irritability and denies any symptoms of mania [place]. States he last used cannabis about two weeks ago. Denies medication side effects. We have reviewed , using motivational interviewing techniques, issues concerning cannabis use and negative impact it has had on his family dynamics , potential negative impact on mood . Abstinence encouraged. Continue current medications- does not currently need scripts . Will see in three weeks, patient agrees to contact sooner if any worsening prior.     Craige Cotta, MD 08/05/2018, 12:10 PM

## 2018-08-19 ENCOUNTER — Ambulatory Visit (HOSPITAL_COMMUNITY): Payer: Medicare Other | Admitting: Psychiatry

## 2018-09-10 ENCOUNTER — Ambulatory Visit (INDEPENDENT_AMBULATORY_CARE_PROVIDER_SITE_OTHER): Payer: Medicare Other | Admitting: Psychiatry

## 2018-09-10 ENCOUNTER — Other Ambulatory Visit: Payer: Self-pay

## 2018-09-10 ENCOUNTER — Other Ambulatory Visit (HOSPITAL_COMMUNITY): Payer: Self-pay

## 2018-09-10 ENCOUNTER — Encounter (HOSPITAL_COMMUNITY): Payer: Self-pay | Admitting: Psychiatry

## 2018-09-10 DIAGNOSIS — F3342 Major depressive disorder, recurrent, in full remission: Secondary | ICD-10-CM | POA: Diagnosis not present

## 2018-09-10 DIAGNOSIS — F1210 Cannabis abuse, uncomplicated: Secondary | ICD-10-CM

## 2018-09-10 DIAGNOSIS — F332 Major depressive disorder, recurrent severe without psychotic features: Secondary | ICD-10-CM

## 2018-09-10 MED ORDER — VENLAFAXINE HCL ER 75 MG PO CP24: ORAL_CAPSULE | ORAL | 1 refills | Status: DC
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
## 2018-10-18 ENCOUNTER — Encounter: Payer: Self-pay | Admitting: Psychiatry

## 2018-10-18 NOTE — Progress Notes (Signed)
Patient ID: ARMIN YERGER, male   DOB: 1951-01-12, 68 y.o.   MRN: [id number] 10/18/2018 Phone documentation. Patient's wife , Clarene Critchley, called - she had  left a packet earlier this week for me at main hospital, with transcripts of patient's text messages with her and with her children. She  stated that patient has been better , calmer over recent days and that " things have settled down some", but states she wanted to show, through his texts, how he has been more irritable and adversarial in the context of cannabis use , leading for her and her children to avoid him at times .  She understands that going to a magistrate to seek commitment is an option if she thinks there is imminent risk to himself or others, but she states this is not the case at this time.  I attempted to contact patient at his phone number - no answer. Appointment made to see patient on July 14th . Regan/ Medical Asst to contact patient to confirm appointment. Gabriel Earing MD

## 2018-10-22 ENCOUNTER — Other Ambulatory Visit: Payer: Self-pay

## 2018-10-22 ENCOUNTER — Other Ambulatory Visit (HOSPITAL_COMMUNITY): Payer: Self-pay

## 2018-10-22 ENCOUNTER — Ambulatory Visit (INDEPENDENT_AMBULATORY_CARE_PROVIDER_SITE_OTHER): Payer: Medicare Other | Admitting: Psychiatry

## 2018-10-22 DIAGNOSIS — F332 Major depressive disorder, recurrent severe without psychotic features: Secondary | ICD-10-CM

## 2018-10-22 MED ORDER — VENLAFAXINE HCL ER 75 MG PO CP24: ORAL_CAPSULE | ORAL | 1 refills | Status: DC

## 2018-10-22 MED ORDER — QUETIAPINE FUMARATE 50 MG PO TABS: 50.0000 mg | ORAL_TABLET | Freq: Every day | ORAL | 0 refills | Status: DC

## 2018-10-22 NOTE — Progress Notes (Addendum)
[facility] MD/PA/NP OP Progress Note  10/22/2018 4:43 PM DERWOOD BECRAFT  MRN:  [id number]  Chief Complaint: Medication management appointment  HPI: 68 year old male, retired physician, history of MDD and cannabis use disorder. Returns for medication appointment.  Of note, his wife [name]) who comes to appointments with him consistently, has expressed concern that patient has been irritable, easily angered, more impulsive, and that this is because the rift in his relationship with both her and their adult children.  In fact, over recent weeks they have been separated, although continue to maintain daily contact. She states that she is convinced that cannabis causes these behavioral changes and that when patient is abstinent from cannabis his behavior normalizes/improves.  She brought some several printouts of text messages between him, her and her adult daughters to illustrate above, and reports as examples that he has called his daughter "a drunk" and has been demanding another daughter to pay back a loan he gave her in the past.  I met with patient alone initially and then with his wife (with his expressed consent). Patient has a well-established history of major depression, which has been severe in the past. There is no prior history of mania or hypomania but it is clear that he develops increased irritability, impulsivity, and some hypomanic symptoms in the context of cannabis use. There have been prior episodes in which patient has smoked cannabis (for instance during vacation, or during trips to see his brother and [place]) and has subsequently has presented with similar  behavioral changes. Today patient expresses irritability that he feels "my family has turned her back on me" but is expressing some increased insight regarding behavioral changes occurring under the effect of cannabis.  States he realizes it causes him to "say hurtful things I would not say otherwise" and that it is contributing to  family tension and distancing. He describes, however, some ambivalence about stopping cannabis(which he states he is now smoking about once or twice a week) because he feels it helps him feel more creative and less inhibited.  Patient presents alert, attentive, there is no psychomotor agitation.  There is no pressured speech although sometimes he does become loud during interaction with his wife.  No thought disorder.  No psychotic symptoms.  No grandiose ideations but ruminative about having "worked hard all my life and now it is my time to enjoy things".  O x3.  States he is sleeping well at night although wife states that she has noted that some of his texts occur late at night.  Patient denies depression and currently denies neurovegetative symptoms.  He states he is sleeping well, appetite is good, describes energy level as normal/not excessive.  Denies anhedonia and states he is enjoying working on his home, playing golf, and investing in [place] note, there are no grounds for any involuntary commitment.  Patient denies any suicidal ideations, there are no homicidal ideations, there are no psychotic symptoms, and he has been functioning independently in his daily activities.  We have reviewed however potential risks associated with increased impulsivity.  We discussed possibility of him going to a rehab setting to address substance abuse, but he states he is not ready to make that decision yet.  He is however interested in having a family meeting with his wife and adult children, which he states "I know it  will be an intervention" because he knows that they have been impacted. We also reviewed medication issues.  For years patient has been stable  on Effexor XR 225 mg daily (also takes BuSpar for anxiety).  We have reviewed how antidepressant medications can have a detrimental effect on bipolar symptoms(which in this case are substance-induced).   Visit Diagnosis:    ICD-10-CM   1. Major  depressive disorder, recurrent, severe without psychotic features ([place])  F33.2 DISCONTINUED: venlafaxine XR (EFFEXOR-XR) 75 MG 24 hr capsule    Past Psychiatric History:   Past Medical History:  Past Medical History:  Diagnosis Date  . Cannabis dependence ([place]) 03/13/2014  . Depression   . Hypertension 05/19/14    Past Surgical History:  Procedure Laterality Date  . NO PAST SURGERIES      Family Psychiatric History:   Family History:  Family History  Problem Relation Age of Onset  . Depression Sister   . Schizophrenia Sister   . Heart attack Father     Social History:  Social History   Socioeconomic History  . Marital status: Married    Spouse name: Not on file  . Number of children: Not on file  . Years of education: Not on file  . Highest education level: Not on file  Occupational History  . Not on file  Social Needs  . Financial resource strain: Patient refused  . Food insecurity    Worry: Patient refused    Inability: Patient refused  . Transportation needs    Medical: Patient refused    Non-medical: Patient refused  Tobacco Use  . Smoking status: Former Smoker    Packs/day: 0.50    Types: Cigarettes    Start date: 04/10/2016    Quit date: 04/10/2016    Years since quitting: 2.5  . Smokeless tobacco: Former Network engineer and Sexual Activity  . Alcohol use: No    Alcohol/week: 0.0 standard drinks  . Drug use: No    Types: Marijuana    Comment: last used about a month and half ago  . Sexual activity: Yes    Partners: Female    Birth control/protection: None  Lifestyle  . Physical activity    Days per week: Patient refused    Minutes per session: Patient refused  . Stress: Patient refused  Relationships  . Social Herbalist on phone: Patient refused    Gets together: Patient refused    Attends religious service: Patient refused    Active member of club or organization: Patient refused    Attends meetings of clubs or organizations:  Patient refused    Relationship status: Patient refused  Other Topics Concern  . Not on file  Social History Narrative  . Not on file    Allergies: No Known Allergies  Metabolic Disorder Labs: No results found for: HGBA1C, MPG No results found for: PROLACTIN No results found for: CHOL, TRIG, HDL, CHOLHDL, VLDL, LDLCALC No results found for: TSH  Therapeutic Level Labs: No results found for: LITHIUM No results found for: VALPROATE No components found for:  CBMZ  Current Medications: Current Outpatient Medications  Medication Sig Dispense Refill  . benazepril (LOTENSIN) 10 MG tablet Take 10 mg by mouth daily.    . busPIRone (BUSPAR) 15 MG tablet Take 1 tablet (15 mg total) by mouth 2 (two) times daily. 60 tablet 2  . QUEtiapine (SEROQUEL) 50 MG tablet Take 1 tablet (50 mg total) by mouth at bedtime. 21 tablet 0  . rosuvastatin (CRESTOR) 5 MG tablet   1  . venlafaxine XR (EFFEXOR-XR) 75 MG 24 hr capsule TAKE 2 CAPSULES BY MOUTH DAILY  WITH BREAKFAST 90 capsule 1   No current facility-administered medications for this visit.      Musculoskeletal: Strength & Muscle Tone: within normal limits Gait & Station: normal Patient leans: N/A  Psychiatric Specialty Exam: ROS no chest pain, no shortness of breath, no cough, no fever, no chills  There were no vitals taken for this visit.There is no height or weight on file to calculate BMI.  General Appearance: Well Groomed  Eye Contact:  Good  Speech:  Normal Rate-not pressured  Volume:  Normal-loud at times  Mood:  Denies depression, states mood is "okay"  Affect:  Congruent and Vaguely irritable at times  Thought Process:  Linear and Descriptions of Associations: Intact no flight of ideations or loose thought process  Orientation: Fully alert attentive and oriented x3  Thought Content: Linear, intact associations   Suicidal Thoughts:  No no suicidal or homicidal ideations  Homicidal Thoughts:  No  Memory:  Recent and remote  intact  Judgement: Present  Insight:  Fair  Psychomotor Activity:  Normal-no psychomotor agitation  Concentration:  Concentration: Good and Attention Span: Good  Recall:  Good  Fund of Knowledge: Good  Language: Good  Akathisia:  Negative  Handed:  Right  AIMS (if indicated): Aims test not done  Assets:  Desire for Improvement Resilience  ADL's:  Intact  Cognition: WNL  Sleep:  Good-patient reports he is sleeping well   Screenings: ECT-MADRS     ECT Treatment from 05/05/2015 in [facility] Total Score  33    Mini-Mental     ECT Treatment from 05/05/2015 in [facility]  Total Score (max 30 points )  30       Assessment and Plan:  68 year old retired Engineer, drilling.  History of MDD and cannabis use disorder.  There is an established pattern of increased irritability, anger, and hostility in the context of cannabis use.  Of note, has no prior history of bipolar disorder.  He is currently using cannabis regularly but not daily and wife has reported that he has been "mean" and verbally abusive towards her and his adult children at times resulting in family distancing.  Patient tends to attribute his irritability to the family distancing and has in the past to minimize the negative impact of cannabis but today (using motivational/nonconfrontational techniques) does acknowledge that cannabis is causing some unwanted behavioral changes and is asking for a family meeting/intervention himself.  He is presenting with ambivalence about cannabis use, but does state he will abstain for now.  I have encouraged him to fully abstain from cannabis (denies any other drug abuse) and to consider an inpatient substance abuse rehab but currently not interested in this option.  There are no current grounds for involuntary commitment and although irritable his presentation is not overtly manic and his behavior is in good control. For years his  depression has been well treated with Effexor XR, but based on emergence of hypomanic type symptoms in the context of cannabis use we have discussed medication adjustments-for now we will decrease Effexor XR from 225 mg daily down to 150 mg daily, BuSpar from 30 mg total daily dose to 50 mg total daily dose, and will start Seroquel 50 mg nightly.  We reviewed the side effect profile for Seroquel to include the risk of weight gain, metabolic disturbances, motor disturbances, excessive sedation, serotonin syndrome.  Patient is aware he should not drive or operate any machinery after taking this  medication.  He agrees to contact clinic prior to next appointment should to be any worsening or concern prior. As requested will work on arranging a family meeting.   Jenne Campus, MD 10/22/2018, 4:43 PM

## 2018-11-05 ENCOUNTER — Ambulatory Visit (INDEPENDENT_AMBULATORY_CARE_PROVIDER_SITE_OTHER): Payer: Medicare Other | Admitting: Psychiatry

## 2018-11-05 ENCOUNTER — Encounter (HOSPITAL_COMMUNITY): Payer: Self-pay | Admitting: Psychiatry

## 2018-11-05 ENCOUNTER — Other Ambulatory Visit: Payer: Self-pay

## 2018-11-05 VITALS — BP 128/74 | HR 89 | Temp 98.3°F | Ht 66.0 in | Wt 163.0 lb

## 2018-11-05 DIAGNOSIS — F3342 Major depressive disorder, recurrent, in full remission: Secondary | ICD-10-CM | POA: Diagnosis not present

## 2018-11-05 NOTE — Progress Notes (Signed)
BH MD/PA/NP OP Progress Note  11/05/2018 11:17 AM Jose Perez  MRN:  [id number]  Chief Complaint: At patient's request today we had a family meeting with his wife and his 3 adult children (who participated via teleconference). 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  Briefly, patient has been smoking cannabis regularly over recent weeks to months and family has noted a change in his overall behavior and attitudes which they attribute to the cannabis, based on prior similar experiences in the past.  This has resulted in family distancing (for example, adult daughters have been avoiding visiting more seen patient is often, wife is now living in their second home), which in turn has caused patient to report feeling angry and "abandoned" by his family. We reviewed these dynamics and how they have been affecting family functioning. Using motivational interviewing techniques, we have reviewed patient's perception of negative impact that cannabis has had.  He does acknowledge he realizes that cannabis is causing significant family tension, and emotionally painful separation from those he loves.  He also acknowledges that cannabis "sometimes" causes him to be more irritable or at least more likely to express anger and displeasure/be less inhibited. Of note, patient has no prior history of bipolar disorder and there is no identified clear episode of hypomania or mania but cannabis has (as expressed by family) resulted in clear changes in his overall demeanor and behavior. Currently patient presents calm without psychomotor agitation, without pressured speech, without expansive or overtly hostile/irritable affect, without grandiosity.  He states he is sleeping well and does not endorse recent reckless or high risk behaviors. With facilitation family and patient were able to identify the following pattern-patient's cannabis use results in some increased  irritability, or at least an increased propensity to express displeasure and anger at things that bother him.  Does not turn leads for family members to avoid him in (it should be noted that patient's wife and 1 of his adult daughters are currently in recovery, and that daughter states she avoids some to protect her long-term recovery).  Patient then reacts to family distancing with feeling emotionally hurt and expressing anger at them for it, leading to a vicious cycle. Overall meeting went well and although patient did not commit to sobriety/abstinence he was able to recognize negative impact that cannabis has had on his relationships, he was apologetic for "mean things I have said", and he expressed a desire to spend more quality time with his family. He denies feeling depressed at this time does not endorse significant neurovegetative symptoms and states he is sleeping well.  Denies suicidal ideations . He was recently started on Seroquel in an attempt to address some of the above symptoms.  He states that unfortunately he did not tolerate this medication well as it caused him to feel "sluggish" so he stopped it after taking it only for 2 days.  At this time he continues to take Effexor XR without side effects. Visit Diagnosis: MDD by history, cannabis use disorder Past Psychiatric History:   Past Medical History:  Past Medical History:  Diagnosis Date  . Cannabis dependence (HCC) 03/13/2014  . Depression   . Hypertension 05/19/14    Past Surgical History:  Procedure Laterality Date  . NO PAST SURGERIES      Family Psychiatric History:   Family History:  Family History  Problem Relation Age of Onset  . Depression Sister   . Schizophrenia Sister   . Heart attack  Father     Social History:  Social History   Socioeconomic History  . Marital status: Married    Spouse name: Not on file  . Number of children: Not on file  . Years of education: Not on file  . Highest education level:  Not on file  Occupational History  . Not on file  Social Needs  . Financial resource strain: Patient refused  . Food insecurity    Worry: Patient refused    Inability: Patient refused  . Transportation needs    Medical: Patient refused    Non-medical: Patient refused  Tobacco Use  . Smoking status: Former Smoker    Packs/day: 0.50    Types: Cigarettes    Start date: 04/10/2016    Quit date: 04/10/2016    Years since quitting: 2.5  . Smokeless tobacco: Former Network engineer and Sexual Activity  . Alcohol use: No    Alcohol/week: 0.0 standard drinks  . Drug use: No    Types: Marijuana    Comment: last used about a month and half ago  . Sexual activity: Yes    Partners: Female    Birth control/protection: None  Lifestyle  . Physical activity    Days per week: Patient refused    Minutes per session: Patient refused  . Stress: Patient refused  Relationships  . Social Herbalist on phone: Patient refused    Gets together: Patient refused    Attends religious service: Patient refused    Active member of club or organization: Patient refused    Attends meetings of clubs or organizations: Patient refused    Relationship status: Patient refused  Other Topics Concern  . Not on file  Social History Narrative  . Not on file    Allergies: No Known Allergies  Metabolic Disorder Labs: No results found for: HGBA1C, MPG No results found for: PROLACTIN No results found for: CHOL, TRIG, HDL, CHOLHDL, VLDL, LDLCALC No results found for: TSH  Therapeutic Level Labs: No results found for: LITHIUM No results found for: VALPROATE No components found for:  CBMZ  Current Medications: Current Outpatient Medications  Medication Sig Dispense Refill  . benazepril (LOTENSIN) 10 MG tablet Take 10 mg by mouth daily.    . busPIRone (BUSPAR) 15 MG tablet Take 1 tablet (15 mg total) by mouth 2 (two) times daily. 60 tablet 2  . QUEtiapine (SEROQUEL) 50 MG tablet Take 1 tablet (50 mg  total) by mouth at bedtime. 21 tablet 0  . rosuvastatin (CRESTOR) 5 MG tablet   1  . venlafaxine XR (EFFEXOR-XR) 75 MG 24 hr capsule TAKE 2 CAPSULES BY MOUTH DAILY WITH BREAKFAST 90 capsule 1   No current facility-administered medications for this visit.      Musculoskeletal: Strength & Muscle Tone: within normal limits Gait & Station: normal Patient leans: N/A  Psychiatric Specialty Exam: ROS negative  Blood pressure 128/74, pulse 89, temperature 98.3 F (36.8 C), height 5\' 6"  (1.676 m), weight 163 lb (73.9 kg), SpO2 98 %.Body mass index is 26.31 kg/m.  General Appearance: Well Groomed  Eye Contact:  Good  Speech:  Normal Rate  Volume:  Normal  Mood:  Generally euthymic  Affect:  Full in range, and during course of this session exhibited an appropriate range of affect  Thought Process:  Linear and Descriptions of Associations: Intact  Orientation:  Full (Time, Place, and Person)  Thought Content: No hallucinations, no delusions   Suicidal Thoughts:  No denies any  suicidal ideations  Homicidal Thoughts:  No  Memory: Recent and remote grossly intact  Judgement:  Fair/improving  Insight:  Fair/improving  Psychomotor Activity:  Normal-no psychomotor agitation or restlessness  Concentration:  Concentration: Good and Attention Span: Good  Recall:  Good  Fund of Knowledge: Good  Language: Good  Akathisia:  Negative  Handed:  Right  AIMS (if indicated): No abnormal involuntary movements noted or reported  Assets:  Desire for Improvement Resilience  ADL's:  Intact  Cognition: WNL  Sleep:  Good   Screenings: ECT-MADRS     ECT [facility]05[facility]  MADRS Total Score  33    Mini-Mental     [facility]  Total Score (max 30 points )  30       Assessment and Plan:  At patient's request today we had a family meeting with patient's wife and patient (and  person) and patient's 3 adult children via telecommunication.  The session lasted for an hour.  As described above, patient has a history of intermittent cannabis abuse throughout the years.  Recently and possibly in the context of retirement he does acknowledge she has been smoking more often.  Family has expressed and noted that when patient smokes cannabis he tends to become more irritable, more impulsive, and more prone to say mean or hurtful things to them during arguments.  This has resulted in family members distancing themselves from patient.  Today patient presents calm and there were no clear symptoms noted or reported of mania or hypomania.  He does recognize that cannabis has had a negative impact on his family, he did acknowledge that it may cause him to be more disinhibited and " say things I would otherwise not say".  He expressed a desire to improve family communication and I hope to spend more time with them.  He denied suicidal ideations and was future oriented. He expressed a desire to have further sessions like these in the future as he feels they were helpful in clarifying family dynamics and improving their overall communication patterns..  I have clearly informed patient that I do think that cannabis has had a negative impact on his overall mood and clearly on his relationships and have advised him to abstain from cannabis, to consider attending 12-step program.  Based on his history, there is no prior history of bipolarity or any clear presentation of mania, but as described by family, it does appear that patient becomes more irritable, easily angered WHEN using cannabis.  For now continue Effexor XR which he is now taking at 150 mg daily.  As noted he did not tolerate Seroquel well, so it has been discontinued We will see patient again in 3 weeks, agrees to contact clinic sooner should to be any worsening or concern prior. Does not need medications prescribed at this  time.   Craige CottaFernando A Cobos, MD 11/05/2018, 11:17 AM

## 2019-01-09 ENCOUNTER — Encounter: Payer: Self-pay | Admitting: Psychiatry

## 2019-01-09 NOTE — Progress Notes (Unsigned)
Patient ID: Jose Perez, male   DOB: 08/05/1950, 68 y.o.   MRN: [id number] 01/09/2019 at 3:30 PM Phone communication documentation  Patient had not returned for appointment or contacted clinic to reschedule.  I contacted patient via phone.  He reports he has been doing well .  States that he has been mostly focused on household repairs and betterment.  Reports improving family relationships.  Denies medication side effects.  Agrees to make a follow-up appointment for later in October.  Gabriel Earing, MD

## 2019-01-22 ENCOUNTER — Other Ambulatory Visit (HOSPITAL_COMMUNITY): Payer: Self-pay | Admitting: Psychiatry

## 2019-01-22 DIAGNOSIS — F332 Major depressive disorder, recurrent severe without psychotic features: Secondary | ICD-10-CM

## 2019-01-28 ENCOUNTER — Ambulatory Visit (HOSPITAL_COMMUNITY): Payer: Medicare Other | Admitting: Psychiatry

## 2019-02-25 ENCOUNTER — Other Ambulatory Visit: Payer: Self-pay

## 2019-02-25 ENCOUNTER — Ambulatory Visit (INDEPENDENT_AMBULATORY_CARE_PROVIDER_SITE_OTHER): Payer: Medicare Other | Admitting: Psychiatry

## 2019-02-25 DIAGNOSIS — F3342 Major depressive disorder, recurrent, in full remission: Secondary | ICD-10-CM

## 2019-02-25 DIAGNOSIS — F332 Major depressive disorder, recurrent severe without psychotic features: Secondary | ICD-10-CM | POA: Diagnosis not present

## 2019-02-25 MED ORDER — BUSPIRONE HCL 15 MG PO TABS: 15.0000 mg | ORAL_TABLET | Freq: Two times a day (BID) | ORAL | 1 refills | Status: DC

## 2019-02-25 MED ORDER — VENLAFAXINE HCL ER 75 MG PO CP24: 225.0000 mg | ORAL_CAPSULE | Freq: Every day | ORAL | 1 refills | Status: DC

## 2019-02-25 NOTE — Progress Notes (Signed)
BH MD/PA/NP OP Progress Note  02/25/2019 5:43 PM Jose Perez  MRN:  [id number]  Chief Complaint: Medication management appointment HPI: This appointment was conducted via phone due to Covid epidemic restrictions.  Patient's identity confirmed using 2 different identifiers.  Limitations associated with this mode of communication have been reviewed. 68 year old married male, retired Development worker, communityphysician, history of MDD, history of cannabis use disorder. Patient reports he is doing well.  Currently denies depression or significant neurovegetative symptoms.  He also denies any manic or hypomanic symptoms and does not present pressured or irritable in affect. He reports he is currently in [place] visiting his son who ran for local office there.  He denies any recent cannabis use.  His wife Rosey Batheresa, who normally accompanies him to face-to-face visits, was also available on this visit via phone.  She corroborates that he is currently stable and doing well.  They drove together to [place], and overall the information given is that their relationship, although still somewhat tense, has improved recently. She does state that she has found cannabis at their home on a couple of occasions, and suspects that his brother who lives in [place] has sent him cannabis.  As noted, he is denying any current cannabis use. They report having had an argument yesterday, regarding his wanting to go to [place].  Wife did not want him to go as she felt that this would put him at risk of using cannabis.  He was upset about wife trying to prevent him from going to [place] but states "I decided not to go to keep the peace".  Denies any suicidal ideations. Currently denies medication side effects.  He states he is taking Effexor XR at 225 mg daily, and apparently did not taper to 150 mg daily but has continued to take same dose (see prior notes).  Also taking BuSpar at 50 mg twice daily. As noted, no manic or hypomanic symptoms are  currently either noted or reported.  Patient states he is sleeping well. Visit Diagnosis: MDD, cannabis use disorder  Past Psychiatric History:   Past Medical History:  Past Medical History:  Diagnosis Date  . Cannabis dependence (HCC) 03/13/2014  . Depression   . Hypertension 05/19/14    Past Surgical History:  Procedure Laterality Date  . NO PAST SURGERIES      Family Psychiatric History:   Family History:  Family History  Problem Relation Age of Onset  . Depression Sister   . Schizophrenia Sister   . Heart attack Father     Social History:  Social History   Socioeconomic History  . Marital status: Married    Spouse name: Not on file  . Number of children: Not on file  . Years of education: Not on file  . Highest education level: Not on file  Occupational History  . Not on file  Social Needs  . Financial resource strain: Patient refused  . Food insecurity    Worry: Patient refused    Inability: Patient refused  . Transportation needs    Medical: Patient refused    Non-medical: Patient refused  Tobacco Use  . Smoking status: Former Smoker    Packs/day: 0.50    Types: Cigarettes    Start date: 04/10/2016    Quit date: 04/10/2016    Years since quitting: 2.8  . Smokeless tobacco: Former Engineer, waterUser  Substance and Sexual Activity  . Alcohol use: No    Alcohol/week: 0.0 standard drinks  . Drug use: No    Types:  Marijuana    Comment: last used about a month and half ago  . Sexual activity: Yes    Partners: Female    Birth control/protection: None  Lifestyle  . Physical activity    Days per week: Patient refused    Minutes per session: Patient refused  . Stress: Patient refused  Relationships  . Social Musician on phone: Patient refused    Gets together: Patient refused    Attends religious service: Patient refused    Active member of club or organization: Patient refused    Attends meetings of clubs or organizations: Patient refused     Relationship status: Patient refused  Other Topics Concern  . Not on file  Social History Narrative  . Not on file    Allergies: No Known Allergies  Metabolic Disorder Labs: No results found for: HGBA1C, MPG No results found for: PROLACTIN No results found for: CHOL, TRIG, HDL, CHOLHDL, VLDL, LDLCALC No results found for: TSH  Therapeutic Level Labs: No results found for: LITHIUM No results found for: VALPROATE No components found for:  CBMZ  Current Medications: Current Outpatient Medications  Medication Sig Dispense Refill  . benazepril (LOTENSIN) 10 MG tablet Take 10 mg by mouth daily.    . busPIRone (BUSPAR) 15 MG tablet Take 1 tablet (15 mg total) by mouth 2 (two) times daily. 60 tablet 2  . rosuvastatin (CRESTOR) 5 MG tablet   1  . venlafaxine XR (EFFEXOR-XR) 75 MG 24 hr capsule TAKE 2 CAPSULES BY MOUTH DAILY WITH BREAKFAST 90 capsule 1   No current facility-administered medications for this visit.       Psychiatric Specialty Exam: Please take into account limitations in obtaining a full mental status exam in the context of phone communication ROS  There were no vitals taken for this visit.There is no height or weight on file to calculate BMI.  General Appearance: NA  Eye Contact:  NA  Speech:  Normal Rate  Volume:  Normal  Mood:  Currently denies depression and presents euthymic, no manic symptoms are currently noted or exhibited  Affect:  Appropriate, reactive  Thought Process:  Linear and Descriptions of Associations: Intact  Orientation:  Other:  Fully alert and attentive  Thought Content: No hallucinations, no delusions   Suicidal Thoughts:  No no suicidal or self-injurious ideations, no homicidal or violent ideations  Homicidal Thoughts:  No  Memory:  Recent and remote grossly intact  Judgement:  Other:  Present  Insight:  Fair/improving  Psychomotor Activity:  NA but denies any restlessness or agitation  Concentration:  Concentration: Good and Attention  Span: Good  Recall:  Good  Fund of Knowledge: Good  Language: Good  Akathisia:  Negative  Handed:  Right  AIMS (if indicated):   Assets:  Communication Skills Desire for Improvement Resilience  ADL's:  Intact  Cognition: WNL  Sleep:  Reports sleeping well   Screenings: ECT-MADRS     ECT [facility]05[facility]  MADRS Total Score  33    Mini-Mental     ECT Treatment from 05/05/2015 in [facility]  Total Score (max 30 points )  30       Assessment and Plan:  68 year old male, retired Development worker, community.  History of MDD and of cannabis use disorder.  He has had episodes of irritability and impulsivity when under the influence of cannabis but no other history suggestive of bipolarity. He is currently reporting  stability and presents euthymic without significant symptoms of depression or of mania/hypomania.  Wife corroborates that patient is currently doing well, although she does report suspicion that he continues to use cannabis intermittently, which he currently minimizes (denying current cannabis use). He is currently on BuSpar and Effexor XR which he is tolerating well.  Denies side effects.  We will continue Effexor XR at 2025 mg daily, BuSpar 15 mg twice daily Patient encouraged to abstain fully/completely from cannabis and we have reviewed the negative impact that this substance has had on his mood and on his family dynamics. We will see again in 1 to 2 months, patient agrees to contact clinic sooner should any worsening or concern prior.   Jenne Campus, MD 02/25/2019, 5:43 PM

## 2019-05-08 ENCOUNTER — Other Ambulatory Visit (HOSPITAL_COMMUNITY): Payer: Self-pay

## 2019-05-08 DIAGNOSIS — F332 Major depressive disorder, recurrent severe without psychotic features: Secondary | ICD-10-CM

## 2019-05-08 MED ORDER — BUSPIRONE HCL 15 MG PO TABS: 15.0000 mg | ORAL_TABLET | Freq: Two times a day (BID) | ORAL | 0 refills | Status: DC

## 2019-05-16 ENCOUNTER — Other Ambulatory Visit (HOSPITAL_COMMUNITY): Payer: Self-pay | Admitting: Psychiatry

## 2019-05-16 DIAGNOSIS — F332 Major depressive disorder, recurrent severe without psychotic features: Secondary | ICD-10-CM

## 2019-05-19 ENCOUNTER — Other Ambulatory Visit (HOSPITAL_COMMUNITY): Payer: Self-pay | Admitting: *Deleted

## 2019-05-26 ENCOUNTER — Encounter (HOSPITAL_COMMUNITY): Payer: Self-pay | Admitting: Psychiatry

## 2019-05-26 ENCOUNTER — Ambulatory Visit (INDEPENDENT_AMBULATORY_CARE_PROVIDER_SITE_OTHER): Payer: Medicare Other | Admitting: Psychiatry

## 2019-05-26 ENCOUNTER — Other Ambulatory Visit: Payer: Self-pay

## 2019-05-26 DIAGNOSIS — F332 Major depressive disorder, recurrent severe without psychotic features: Secondary | ICD-10-CM

## 2019-05-26 DIAGNOSIS — F329 Major depressive disorder, single episode, unspecified: Secondary | ICD-10-CM

## 2019-05-26 DIAGNOSIS — F3341 Major depressive disorder, recurrent, in partial remission: Secondary | ICD-10-CM

## 2019-05-26 MED ORDER — BUSPIRONE HCL 15 MG PO TABS: 15.0000 mg | ORAL_TABLET | Freq: Two times a day (BID) | ORAL | 0 refills | Status: DC

## 2019-05-26 NOTE — Progress Notes (Signed)
BH MD/PA/NP OP Progress Note  05/26/2019 5:28 PM Jose Perez  MRN:  [id number]  Chief Complaint: Medication management appointment HPI: Patient returns for medication management appointment.  This appointment was conducted via phone due to Covid epidemic precautions.  Patient's identity has been verified with 2 separate identifiers.  Limitations associated with this mode of communication have been reviewed.  Patient reports he has been doing well.  He is a retired Development worker, community.  States he is currently spending his time visiting family, and recently returned from a trip to [place] where he visited his adult son who was running for a local political office there.  He is also working on making improvements to his new home and states he enjoys watching sports and following Academic librarian.  He he denies current significant depression or neurovegetative symptoms and denies any symptoms of mania or hypomania.  He denies any suicidal ideations. His wife, [name], was present during this session as well.  She reports ongoing concerns that he is using cannabis on a regular basis.  She states that he smoked cannabis regularly during his trip to [place].  She describes that when he uses cannabis he tends to be more gregarious but also more easily irritated/irritable, engaged in behaviors such as sports betting.  Of note both she and one of their adult children have a history of alcohol use disorder and she states that his cannabis use minimizes and jeopardizes her and her daughter's recovery due to which both her and daughter tend to minimize contact with him (wife is currently staying in their second home). Patient acknowledges that his cannabis use has caused family difficulties and has been a stressor to his marriage.  He states he has not been smoking consistently or daily and has had periods where he does not smoke at all. Currently denies symptoms of hypomania or mania (wife/family have reported  irritability, explosiveness when actively using cannabis, but not otherwise).  He states he is sleeping well, denies insomnia.  Denies any racing thoughts.  Denies irritability.  Speech presents normal and is not pressured.  Denies suicidal ideations and currently presents future oriented, stating that he is looking forward to baseball season, spending  time with his adult son, whom he plans to visit again this summer. Denies medication side effects. Visit Diagnosis:  History of MDD, history of Cannabis Use Disorder  Past Psychiatric History:   Past Medical History:  Past Medical History:  Diagnosis Date  . Cannabis dependence (HCC) 03/13/2014  . Depression   . Hypertension 05/19/14    Past Surgical History:  Procedure Laterality Date  . NO PAST SURGERIES      Family Psychiatric History:   Family History:  Family History  Problem Relation Age of Onset  . Depression Sister   . Schizophrenia Sister   . Heart attack Father     Social History:  Social History   Socioeconomic History  . Marital status: Married    Spouse name: Not on file  . Number of children: Not on file  . Years of education: Not on file  . Highest education level: Not on file  Occupational History  . Not on file  Tobacco Use  . Smoking status: Former Smoker    Packs/day: 0.50    Types: Cigarettes    Start date: 04/10/2016    Quit date: 04/10/2016    Years since quitting: 3.1  . Smokeless tobacco: Former Engineer, water and Sexual Activity  . Alcohol use: No  Alcohol/week: 0.0 standard drinks  . Drug use: No    Types: Marijuana    Comment: last used about a month and half ago  . Sexual activity: Yes    Partners: Female    Birth control/protection: None  Other Topics Concern  . Not on file  Social History Narrative  . Not on file   Social Determinants of Health   Financial Resource Strain:   . Difficulty of Paying Living Expenses: Not on file  Food Insecurity:   . Worried About Patent examiner in the Last Year: Not on file  . Ran Out of Food in the Last Year: Not on file  Transportation Needs:   . Lack of Transportation (Medical): Not on file  . Lack of Transportation (Non-Medical): Not on file  Physical Activity:   . Days of Exercise per Week: Not on file  . Minutes of Exercise per Session: Not on file  Stress:   . Feeling of Stress : Not on file  Social Connections:   . Frequency of Communication with Friends and Family: Not on file  . Frequency of Social Gatherings with Friends and Family: Not on file  . Attends Religious Services: Not on file  . Active Member of Clubs or Organizations: Not on file  . Attends Banker Meetings: Not on file  . Marital Status: Not on file    Allergies: No Known Allergies  Metabolic Disorder Labs: No results found for: HGBA1C, MPG No results found for: PROLACTIN No results found for: CHOL, TRIG, HDL, CHOLHDL, VLDL, LDLCALC No results found for: TSH  Therapeutic Level Labs: No results found for: LITHIUM No results found for: VALPROATE No components found for:  CBMZ  Current Medications: Current Outpatient Medications  Medication Sig Dispense Refill  . venlafaxine XR (EFFEXOR-XR) 75 MG 24 hr capsule TAKE 3 CAPSULES(225 MG) BY MOUTH DAILY WITH BREAKFAST 90 capsule 1  . benazepril (LOTENSIN) 10 MG tablet Take 10 mg by mouth daily.    . busPIRone (BUSPAR) 15 MG tablet Take 1 tablet (15 mg total) by mouth 2 (two) times daily. 60 tablet 0  . rosuvastatin (CRESTOR) 5 MG tablet   1   No current facility-administered medications for this visit.    Psychiatric Specialty Exam: Please take into account limitations in obtaining a full mental status exam in the context of phone communication Review of Systems denies medication side effects  There were no vitals taken for this visit.There is no height or weight on file to calculate BMI.  General Appearance: NA  Eye Contact:  NA  Speech:  Normal Rate  Volume:  Normal   Mood:  Reports mood is "okay" denies feeling depressed.  Affect:  Appropriate  Thought Process:  Linear and Descriptions of Associations: Intact  Orientation:  Full (Time, Place, and Person)  Thought Content: No hallucinations, no delusions   Suicidal Thoughts:  No denies any suicidal or self-injurious ideations  Homicidal Thoughts:  No  Memory:  Recent and remote grossly intact  Judgement:  Other:  Present  Insight:  Fair  Psychomotor Activity:  NA but denies feeling agitated or restless  Concentration:  Concentration: Good and Attention Span: Good  Recall:  Good  Fund of Knowledge: Good  Language: Good  Akathisia:  Negative  Handed:  Right  AIMS (if indicated):   Assets:  Communication Skills Desire for Improvement Resilience  ADL's:  Intact  Cognition: WNL  Sleep:  Good   Screenings: ECT-MADRS  ECT Treatment from 05/05/2015 in [facility]  MADRS Total Score  33    Mini-Mental     ECT Treatment from 05/05/2015 in [facility]  Total Score (max 30 points )  30       Assessment and Plan:  69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  Currently patient reports he is doing well, and denies significant depression or neurovegetative symptoms at this time, reports she is functioning well in his daily activities.  Does express concern about strained family relationships, particular with his wife who is currently residing in another home.  Wife , who is usually present for these appointments at his request, confirmed that his cannabis use has been intermittent but ongoing and has caused a rift between them, partly because she feels that it affects his personality negatively and because she feels it jeopardizes her own recovery (she has a history of alcohol use disorder in sustained remission).  Patient acknowledges experiencing irritability related to cannabis at times but  states that it is usually as a reaction due to his family "giving him a hard time" about cannabis.  At this time he is not endorsing or presenting with symptoms of hypomania or mania.  He is expressing concern about his cannabis use mainly in the context of how it has affected his family relationships and acknowledges feeling ambivalent about ongoing use versus abstinence.  I have encouraged abstinence.  He is not interested in going to residential setting but did express some interest in participating in a program such as [facility] IOP. Tolerating medications well. We will continue Effexor XR to 25 mg daily for depression, BuSpar 15 mg twice daily for anxiety. We will see patient in 3 to 4 weeks for medication management, we will also refer to [facility] IOP, he agrees to contact clinic sooner should there be any worsening or concern prior   Jenne Campus, MD 05/26/2019, 5:28 PM

## 2019-06-19 ENCOUNTER — Telehealth (HOSPITAL_COMMUNITY): Payer: Self-pay | Admitting: *Deleted

## 2019-06-19 NOTE — Telephone Encounter (Signed)
Per Dr. Jama Flavors this clinic has been trying to reach pt to schedule a family session for pt for approximately two weeks. Pt does not answer his phone and his VM is always full. Office will keep trying to schedule.

## 2019-06-25 ENCOUNTER — Telehealth (HOSPITAL_COMMUNITY): Payer: Self-pay | Admitting: *Deleted

## 2019-06-25 NOTE — Telephone Encounter (Signed)
Writer left VM for pt informing him that clinic has been attempting to schedule a family session with Dr.Cobos but pt has been unavailable. Pt instructed to call clinic back as soon as possible to schedule session.

## 2019-07-07 ENCOUNTER — Encounter (HOSPITAL_COMMUNITY): Payer: Self-pay | Admitting: Psychiatry

## 2019-07-07 NOTE — Progress Notes (Signed)
Patient ID: Jose Perez, male   DOB: 02-23-1951, 69 y.o.   MRN: [id number] Phone documentation 07/07/2019 at 12 noon  I spoke with patient via phone.  He informs me he is doing well.  He is currently in [place] spending time with his oldest son.  He reports his mood is stable and is enjoying time spent with his family.  He reports there has been decreased tension and overall improved relationship with his wife and other children. Denies medication side effects He plans to be back in [place] in about 3 weeks at which time he will follow-up for next appointment.  Sallyanne Havers, MD

## 2019-07-21 ENCOUNTER — Ambulatory Visit (INDEPENDENT_AMBULATORY_CARE_PROVIDER_SITE_OTHER): Payer: Medicare Other | Admitting: Psychiatry

## 2019-07-21 ENCOUNTER — Other Ambulatory Visit: Payer: Self-pay

## 2019-07-21 DIAGNOSIS — F3342 Major depressive disorder, recurrent, in full remission: Secondary | ICD-10-CM | POA: Diagnosis not present

## 2019-07-22 ENCOUNTER — Encounter (HOSPITAL_COMMUNITY): Payer: Self-pay | Admitting: Psychiatry

## 2019-07-22 NOTE — Progress Notes (Signed)
BH MD/PA/NP OP Progress Note  07/22/2019 8:58 AM Jose Perez  MRN:  [id number]  Chief Complaint: Medication management appointment HPI: This session was conducted via phone due to Covid epidemic restrictions.  Patient's identity was verified using 2 separate identifiers.  Limitations associated with this type of medication have been reviewed.  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  He is currently in [place] with family.  He was recently in [place] with his older son.  He states that he is enjoying his retirement.  Currently denies depression or significant neurovegetative symptoms.  He describes his mood as normal/euthymic and appears euthymic during this interaction.  Regarding cannabis use, he states he continues to use occasionally but not daily or as regularly as before. We discussed/reviewed history.  Patient has a documented history of MDD with an episode of severe depression years ago which required psychiatric admission and ECT treatment.  He has a history of cannabis use disorder dating back to his young adulthood with intermittent periods of sobriety/abstinence.  Since he retired his cannabis use has tended to increase.  He perceives that cannabis generally is not harmful to him and helps him feel more relaxed and creative.  He has had occasional episodes of increased irritability, anger associated with cannabis use (no such episodes when not using cannabis reported or noted), states that these episodes have been occasional and certainly not occurring often or every time he uses this substance.  He thinks that these episodes could have been associated with cannabis that was laced with some stimulant unbeknownst to him. His wife and one of his adult daughters have a history of alcohol abuse and are currently in recovery and very involved in [place].  There has been ongoing family tension regarding his limited motivation in abstinence/sobriety at this time, and how this may affect his  daughter's recovery efforts.  He also states that this family tension and their insistence that he stop smoking cannabis and attend 12-step programs has contributed to episodic irritability. Tolerating medications well, denies any side effects. As above currently describes his mood as stable.  Presents euthymic.  Denies any symptoms associated with hypomania or mania.  Sleeping well, denies any impulsive or reckless behaviors.  Speech is normal and not pressured. Thought process is  linear. Visit Diagnosis: MDD by history, Cannabis Use Disorder Past Psychiatric History:   Past Medical History:  Past Medical History:  Diagnosis Date  . Cannabis dependence (HCC) 03/13/2014  . Depression   . Hypertension 05/19/14    Past Surgical History:  Procedure Laterality Date  . NO PAST SURGERIES      Family Psychiatric History:   Family History:  Family History  Problem Relation Age of Onset  . Depression Sister   . Schizophrenia Sister   . Heart attack Father     Social History:  Social History   Socioeconomic History  . Marital status: Married    Spouse name: Not on file  . Number of children: Not on file  . Years of education: Not on file  . Highest education level: Not on file  Occupational History  . Not on file  Tobacco Use  . Smoking status: Former Smoker    Packs/day: 0.50    Types: Cigarettes    Start date: 04/10/2016    Quit date: 04/10/2016    Years since quitting: 3.2  . Smokeless tobacco: Former Engineer, water and Sexual Activity  . Alcohol use: No    Alcohol/week: 0.0 standard drinks  .  Drug use: No    Types: Marijuana    Comment: last used about a month and half ago  . Sexual activity: Yes    Partners: Female    Birth control/protection: None  Other Topics Concern  . Not on file  Social History Narrative  . Not on file   Social Determinants of Health   Financial Resource Strain:   . Difficulty of Paying Living Expenses:   Food Insecurity:   . Worried  About Charity fundraiser in the Last Year:   . Arboriculturist in the Last Year:   Transportation Needs:   . Film/video editor (Medical):   Marland Kitchen Lack of Transportation (Non-Medical):   Physical Activity:   . Days of Exercise per Week:   . Minutes of Exercise per Session:   Stress:   . Feeling of Stress :   Social Connections:   . Frequency of Communication with Friends and Family:   . Frequency of Social Gatherings with Friends and Family:   . Attends Religious Services:   . Active Member of Clubs or Organizations:   . Attends Archivist Meetings:   Marland Kitchen Marital Status:     Allergies: No Known Allergies  Metabolic Disorder Labs: No results found for: HGBA1C, MPG No results found for: PROLACTIN No results found for: CHOL, TRIG, HDL, CHOLHDL, VLDL, LDLCALC No results found for: TSH  Therapeutic Level Labs: No results found for: LITHIUM No results found for: VALPROATE No components found for:  CBMZ  Current Medications: Current Outpatient Medications  Medication Sig Dispense Refill  . venlafaxine XR (EFFEXOR-XR) 75 MG 24 hr capsule TAKE 3 CAPSULES(225 MG) BY MOUTH DAILY WITH BREAKFAST 90 capsule 1  . benazepril (LOTENSIN) 10 MG tablet Take 10 mg by mouth daily.    . busPIRone (BUSPAR) 15 MG tablet Take 1 tablet (15 mg total) by mouth 2 (two) times daily. 60 tablet 0  . rosuvastatin (CRESTOR) 5 MG tablet   1   No current facility-administered medications for this visit.     Musculoskeletal:  Psychiatric Specialty Exam: Please take into account limitations in obtaining a full mental site exam associated with wound medication Review of Systems no chest pain, no shortness of breath, no cough, no vomiting  There were no vitals taken for this visit.There is no height or weight on file to calculate BMI.  General Appearance: NA  Eye Contact:  NA  Speech:  Normal Rate  Volume:  Normal  Mood:  Euthymic  Affect:  Appropriate  Thought Process:  Linear and  Descriptions of Associations: Intact  Orientation:  Full (Time, Place, and Person)  Thought Content: No hallucinations, no delusions   Suicidal Thoughts:  No denies suicidal or self-injurious ideations  Homicidal Thoughts:  No  Memory:  Recent and remote grossly intact  Judgement:  Other:  Present  Insight:  Fair/present  Psychomotor Activity:  NA  Concentration:  Concentration: Good and Attention Span: Good  Recall:  Good  Fund of Knowledge: Good  Language: Good  Akathisia:  Negative  Handed:  Right  AIMS (if indicated):   Assets:  Desire for Improvement Financial Resources/Insurance Housing Resilience Social Support  ADL's:  Intact  Cognition: WNL  Sleep:  Good   Screenings: ECT-MADRS     ECT Treatment from 05/05/2015 in [facility]  MADRS Total Score  33    Mini-Mental     ECT Treatment from 05/05/2015 in [facility]  Total Score (max 30 points )  30       Assessment and Plan:  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  Currently reports he is doing well.  He has been traveling to visit his adult children.  Was recently in [place] and is now in [place] with his oldest son. He currently denies depression or neurovegetative symptoms and appears euthymic.  He is tolerating medications well without side effects. We have reviewed history and issues regarding cannabis use disorder.  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  Since his retirement his cannabis use has tended to increase.  This has led to ongoing strain with his family and particularly with wife and one of his adult daughters who have a history of alcohol use disorder and are in recovery and very active in [place].  He has had some episodes of increased irritability, anger associated with cannabis use (has had no such symptoms in  the absence of cannabis) but points out that these episodes have been infrequent and rare and that for the most part when he uses cannabis he feels relaxed and calm.  He suspects that these episodes could have been related to cannabis that was laced with some stimulants unbeknownst to him. He states he is making efforts to cut down on smoking and has been using less than before which she acknowledges is mostly to "keep the peace" with his wife and family.  He does have a well-documented history of depression and has been stable for years on Effexor XR which he is tolerating well. Continue Effexor XR 225 mg daily for depression, continue BuSpar 15 mg twice daily for anxiety. Using motivational interviewing techniques/nonconfrontational techniques we have reviewed the negative impact that cannabis has had, particularly on his family relationships and have reviewed potential benefits of abstinence. I have encouraged patient to consider the [facility] IOP. We will see in 6 to 8 weeks at which time he states he will back in [place].  Patient agrees to contact clinic sooner should be any worsening or concern prior.  Craige Cotta, MD 07/22/2019, 8:58 AM

## 2019-08-30 ENCOUNTER — Other Ambulatory Visit (HOSPITAL_COMMUNITY): Payer: Self-pay | Admitting: Psychiatry

## 2019-08-30 DIAGNOSIS — F332 Major depressive disorder, recurrent severe without psychotic features: Secondary | ICD-10-CM

## 2019-09-01 ENCOUNTER — Other Ambulatory Visit (HOSPITAL_COMMUNITY): Payer: Self-pay | Admitting: *Deleted

## 2019-09-01 DIAGNOSIS — F332 Major depressive disorder, recurrent severe without psychotic features: Secondary | ICD-10-CM

## 2019-09-01 MED ORDER — VENLAFAXINE HCL ER 75 MG PO CP24: ORAL_CAPSULE | ORAL | 1 refills | Status: DC

## 2019-11-07 ENCOUNTER — Telehealth (HOSPITAL_COMMUNITY): Payer: Self-pay | Admitting: *Deleted

## 2019-11-07 NOTE — Telephone Encounter (Signed)
Writer placed t/c to pt per Dr. Jama Flavors to just touch base and see if pt is ok, or needs anything from this office. Writer had to leave a VM. Pt was asked to please call office and let us know if he needs appointment or any help from this office.

## 2019-12-16 ENCOUNTER — Encounter (HOSPITAL_COMMUNITY): Payer: Self-pay | Admitting: Psychiatry

## 2019-12-16 NOTE — Progress Notes (Signed)
Patient ID: Jose Perez, male   DOB: Jul 25, 1950, 69 y.o.   MRN: [id number] 12/16/2019 at about 11 AM   Telephone contact documentation   I spoke with patient via phone. He reports he is doing well. He states his mood has been stable.  Denies feeling depressed.  He also denies any symptoms of mania or hypomania and reports he has been sleeping well, denies racing thoughts, denies increased energy or engaging in high risk behaviors. He reports his relationship with family (wife, adult children) has tended to improve and is currently cordial and supportive. He states he is using cannabis occasionally but not often. Of note, states that he decided to wean himself off Effexor XR, which he did over a period of about 3 weeks, he is now off this medication for several weeks and denies having any withdrawal symptoms.  States his mood has remained stable and describes his mood is euthymic.  Denies any SI. He reports he has been traveling a lot and has been out of state for long periods of time, mainly in [place] with his adult son and in [place] with other family members.  He states that he is traveling to [place] soon to attend his son's wedding.  He will be back in [place] in about 3 to 4 weeks at which time he would like to arrange for a follow-up session.  As noted he is no longer on any standing psychiatric medication management at this time.  I will ask medical assistant/front desk to contact patient in order to set up a follow-up appointment. I have reviewed potential increased risk of recurrence of mood episode off medication.   Sallyanne Havers MD

## 2019-12-31 ENCOUNTER — Encounter (HOSPITAL_COMMUNITY): Payer: Self-pay | Admitting: Psychiatry

## 2019-12-31 NOTE — Progress Notes (Signed)
12/31/2019 at 11,50 PM  Informed by Staff that attempts have been made to contact patient and set up appointment- no answer.    Sallyanne Havers MD

## 2020-03-22 ENCOUNTER — Other Ambulatory Visit (HOSPITAL_COMMUNITY): Payer: Self-pay | Admitting: Psychiatry

## 2020-03-22 ENCOUNTER — Telehealth (INDEPENDENT_AMBULATORY_CARE_PROVIDER_SITE_OTHER): Payer: Medicare Other | Admitting: Psychiatry

## 2020-03-22 ENCOUNTER — Other Ambulatory Visit: Payer: Self-pay

## 2020-03-22 DIAGNOSIS — F329 Major depressive disorder, single episode, unspecified: Secondary | ICD-10-CM

## 2020-03-22 NOTE — Progress Notes (Signed)
BH MD/PA/NP OP Progress Note  03/22/2020 2:56 PM Jose Perez  MRN:  [id number]  Chief Complaint: Medication management appointment HPI:   Patient had not returned for follow up since 07/2019. He reports that he has been doing well . He states he had not made an appointment partly because he has been travelling a lot . He reports that over recent months he has been in [place] for his son's wedding and to visit family, travelled to [place] on vacation with his wife, and has been in [place] tending to his family home .  He states he feels he has been doing well. He has not been feeling depressed and does not endorse neuro-vegetative symptoms ( reports normal appetite, normal sleep, normal energy level, no anhedonia).  As noted in prior note, he reports he has been off Effexor XR for several months now, and states he does not feel there has been any worsening or emerging mood symptoms since then. States " I actually feel clearer headed off it". He reports he continues to smoke cannabis regularly and last smoked yesterday.  He sometimes goes about a week without smoking but is generally using this substance regularly . He denies using any other drugs or alcohol .  He states he does not feel he has a problem with cannabis and states " I can stop if I want to", and does not feel it causes any untoward or negative effects on his health or behaviors . His wife Aggie Cosier) comes with him to appointments and at patient's request  was present during a portion of today's visit to provide collateral information. She  states that he tends to be irritable and angry related to cannabis use . States he becomes more self absorbed , less empathic, more irritable and easily angered when using cannabis .  As examples, states that he sometimes yells at her if she says something he does not like, so that she feels she needs to " walk on eggshells", that he has been verbally rude to [facility] and other people. Does not  report or indicate any physical violence . She states that she feels these behaviors as cannabis induced as they improve when he is not using , and were absent before he started smoking regularly. She also states that his persistent drug use jeopardizes her own  Long term recovery. She also reports she feels that cannabis makes him more forgetful .  He states he realizes his cannabis use has caused marital friction and tension, and states arguments are related to her concerns about his smoking .   He denies symptoms of mania. He states it is true he has been travelling more often and bought an expensive car last year but states "I am retired, I made good money, and I feel like I am entitled to enjoy retirement".  He states he is sleeping well , 7 hours on average. He denies engaging in risky behaviors ( although does report he has an upcoming court date for a speeding ticket ) . During this visit  does not present with pressured speech or increased motor restlessness, no thought disorder is noted, no grandiosity is noted . MMSE was done - scored 30/30. Had no difficulties with visuo-spatial tasks such as drawing clock or copying figure. Recall 3/3 immediate and 3/3 at 5 minutes  .   I reviewed history and recommendations with Dr. [name]. Reviewed, using motivational techniques, negative consequences of cannabis use, including marital difficulties and reviewed my recommendation  that he abstain from cannabis and work on abstinence and recovery. Also reviewed history of mood disorder - he has a history of a well documented episode of severe depression about 15 years ago at which time required ECT . His mood was stable for years on Effexor XR ( at this time he is not on any psychiatric medications). Although there is no known history of bipolarity, wife has again brought up increased irritability and anger when under the influence of cannabis. We reviewed medication options, to include considering a mood  stabilizer. At this time patient does not want to start any psychiatric medication. He is considering attending Grief therapist, which has been encouraged by his wife as she has felt that his cannabis use started/increased in the context of death of loved ones . He is amenable to couples' therapy and to a referral for neuro-psychological testing .   Visit Diagnosis: MDD by history, Cannabis Use Disorder Past Psychiatric History:   Past Medical History:  Past Medical History:  Diagnosis Date  . Cannabis dependence (HCC) 03/13/2014  . Depression   . Hypertension 05/19/14    Past Surgical History:  Procedure Laterality Date  . NO PAST SURGERIES      Family Psychiatric History:   Family History:  Family History  Problem Relation Age of Onset  . Depression Sister   . Schizophrenia Sister   . Heart attack Father     Social History:  Social History   Socioeconomic History  . Marital status: Married    Spouse name: Not on file  . Number of children: Not on file  . Years of education: Not on file  . Highest education level: Not on file  Occupational History  . Not on file  Tobacco Use  . Smoking status: Former Smoker    Packs/day: 0.50    Types: Cigarettes    Start date: 04/10/2016    Quit date: 04/10/2016    Years since quitting: 3.9  . Smokeless tobacco: Former Clinical biochemistUser  Vaping Use  . Vaping Use: Never used  Substance and Sexual Activity  . Alcohol use: No    Alcohol/week: 0.0 standard drinks  . Drug use: No    Types: Marijuana    Comment: last used about a month and half ago  . Sexual activity: Yes    Partners: Female    Birth control/protection: None  Other Topics Concern  . Not on file  Social History Narrative  . Not on file   Social Determinants of Health   Financial Resource Strain: Not on file  Food Insecurity: Not on file  Transportation Needs: Not on file  Physical Activity: Not on file  Stress: Not on file  Social Connections: Not on file     Allergies: No Known Allergies  Metabolic Disorder Labs: No results found for: HGBA1C, MPG No results found for: PROLACTIN No results found for: CHOL, TRIG, HDL, CHOLHDL, VLDL, LDLCALC No results found for: TSH  Therapeutic Level Labs: No results found for: LITHIUM No results found for: VALPROATE No components found for:  CBMZ  Current Medications: Current Outpatient Medications  Medication Sig Dispense Refill  . benazepril (LOTENSIN) 10 MG tablet Take 10 mg by mouth daily.    . busPIRone (BUSPAR) 15 MG tablet Take 1 tablet (15 mg total) by mouth 2 (two) times daily. 60 tablet 0  . rosuvastatin (CRESTOR) 5 MG tablet   1  . venlafaxine XR (EFFEXOR-XR) 75 MG 24 hr capsule TAKE 3 CAPSULES(225 MG) BY MOUTH  DAILY WITH BREAKFAST 90 capsule 1   No current facility-administered medications for this visit.     Musculoskeletal:  Psychiatric Specialty Exam:  Review of Systems none reported  There were no vitals taken for this visit.There is no height or weight on file to calculate BMI.  General Appearance: well groomed   Eye Contact:  Good   Speech:  Normal Rate- not pressured or loud   Volume:  Normal  Mood:  Reports mood is " normal" and appears euthymic, at this time does not present depressed nor irritable/expansive   Affect:  Appropriate- no irritability or expansiveness   Thought Process:  Linear and Descriptions of Associations: Intact  Orientation:  Full (Time, Place, and Person)  Thought Content: No hallucinations, no delusions   Suicidal Thoughts:  No denies suicidal or self-injurious ideations  Homicidal Thoughts:  No denies any violent ideations  Memory:  Recent and remote grossly intact  Judgement:  Other:  Present  Insight:  Fair  Psychomotor Activity:  No psychomotor agitation or restlessness   Concentration:  Concentration: Good and Attention Span: Good  Recall:  Good  Fund of Knowledge: Good  Language: Good  Akathisia:  Negative  Handed:  Right  AIMS (if  indicated):   Assets:  Desire for Improvement Financial Resources/Insurance Housing Resilience Social Support  ADL's:  Intact  Cognition: WNL- scored 30/30 on MSE today  Sleep:  Good- reports sleeping 7 hours per night   Screenings: ECT-MADRS   Flowsheet Row ECT Treatment from [facility]/05/2015 in [facility]  MADRS Total Score 33    Mini-Mental   Flowsheet Row ECT Treatment from [facility]05/2015 in [facility]  Total Score (max 30 points ) 30       Assessment and Plan:   Patient had not returned for follow up since April/2021.  He is now retired , and reports has been doing more travelling/spending time in [place] with his adult son.  He has stopped Effexor XR several months ago, and is not taking any psychiatric medications at this time. He reports he is smoking cannabis regularly, last time yesterday- denies using other drugs or alcohol. He states he does not feel there has been any negative effect or adverse outcome related to being off antidepressant or from regular cannabis use, other than increased family tension, in particular with his wife . His wife, Aurther Loft, was present ( at his request ) during a portion of today's visit and states that she clearly notices he becomes more irritable/ angry  when using cannabis , yelling when angered,  and that she feels she needs to walk " on eggshells " due to this .There has been no physical violence/ none reported. Today also reports she feels cannabis causes him to be more forgetful.  Currently patient is not endorsing symptoms of mania/hypomania, and does not present restless or hyperactive, without pressured speech, without thought disorder or overt grandiosity. He denies pattern of impulsive or reckless behaviors . He scored 30/30 on MMSE today.  We have reviewed above concerns, using motivational interviewing techniques and I have again reinforced my recommendation that he  abstain from cannabis and consider substance abuse/CD focused treatment. I have also reviewed potential increased risk of mood decompensation associated with being off psychiatric medications and using cannabis regularly .   At this time he does not want to start psychiatric medication. He is considering starting individual psychotherapy focused on grief/loss of loved ones , as wife states that  cannabis use and mood issues tended to start at the time of loss of loved ones ( sister, parents ) . He and wife plan to start couples' therapy in [place] area . He is agreeing to referral for neuro-psychological testing .  He states he will make a follow up appointment in January. Agrees to go to ED if any acute concerns . No medications prescribed at present .   Craige Cotta, MD 03/22/2020, 2:56 PM

## 2020-04-19 ENCOUNTER — Ambulatory Visit (INDEPENDENT_AMBULATORY_CARE_PROVIDER_SITE_OTHER): Payer: Medicare Other | Admitting: Psychiatry

## 2020-04-19 ENCOUNTER — Other Ambulatory Visit: Payer: Self-pay

## 2020-04-19 DIAGNOSIS — F3340 Major depressive disorder, recurrent, in remission, unspecified: Secondary | ICD-10-CM

## 2020-04-19 NOTE — Progress Notes (Signed)
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  He reports he had a good holiday season with his wife and youngest daughter, who travelled from [place] to spend time with them . He states that he feels there has been less marital tension recently, although states that she continues to disapprove of his cannabis use . States he has been smoking cannabis about 2-3 x per week on average. Denies alcohol or other drug abuse .  Using non confrontational /motivational interviewing techniques, we reviewed cannabis use . He states that he feels this substance helps by providing a sense of well being and subjectively feeling more creative . He does identify negatives today,however,to include marital tension /family disapproval of use and concern that smoke may cause pulmonary disease . He also does acknowledge that there have been instances of feeling some  irritability when using cannabis , but states that these have been occasional /infrequent and has suspected that it could have been related to cannabis being laced with some other unknown substance at the time.  He is currently not taking any psychiatric medications . He has a well documented history of severe major depressive episodes in the past, to include at least two prior psychiatric admissions for depression and a course of ECT . Last psychiatric admission was about 15 years ago. He was on Effexor XR for many years during which he was mainly depression free , but discontinued this medication last year and has not been on any psychiatric medications for a period of about 5-6 months.  Denies depression at this time. States mood has been " pretty good ". Denies neuro-vegetative symptoms of anhedonia. Denies SI. At this time presents euthymic.   Possible substance induced hypomania has been reviewed as well  . As above, he states he has had time limited episodes of increased irritability with cannabis use in the past, but infrequently.  He denies any prior history of hypomania or mania .  At this time does not endorse symptoms of mania . He is sleeping well ( 6-7 hours per night) , denies racing thoughts, denies hypersexuality or increased spending , although does state that he is enjoying his retirement and engaging in activities he was unable to do when working full time, such as traveling to see his children more often, watching live baseball games   At this time he is not exhibiting symptoms of mania. He is not presenting with pressured speech, psychomotor agitation , expanse/irritable affect, or grandiose ideations.   We have reviewed medication management issues . Have reviewed , based on his history of past recurrent episodes of major depression , the potential for further recurrences and the potential for increased risk off antidepressant medication. Have reviewed potential benefits of resuming medication management. We have also reviewed concerns of cannabis induced mood disorder and potential psychiatric effects of cannabis abuse, and have recommended abstinence . As above, have worked on this using motivational interviewing techniques and he is identifying some negative aspects of substance use at this time.  He states he plans to start  couples' therapy soon. In past visit we had also discussed referral for neuropsychological testing . He states he will consider, but would need to know price of this first, as does not think it would be covered by insurance .  He prefers not to start any  psychiatric medication at this time, but states he will monitor mood and contact clinic if needed  . He does request  to continue seeing me for now, to continue providing support and reviewing issues as above. States " I feel this helps me " .   Visit Diagnosis: MDD by history, Cannabis Use Disorder Past  Psychiatric History:   Past Medical History:  Past Medical History:  Diagnosis Date  . Cannabis dependence (HCC) 03/13/2014  . Depression   . Hypertension 05/19/14    Past Surgical History:  Procedure Laterality Date  . NO PAST SURGERIES      Family Psychiatric History:   Family History:  Family History  Problem Relation Age of Onset  . Depression Sister   . Schizophrenia Sister   . Heart attack Father     Social History:  Social History   Socioeconomic History  . Marital status: Married    Spouse name: Not on file  . Number of children: Not on file  . Years of education: Not on file  . Highest education level: Not on file  Occupational History  . Not on file  Tobacco Use  . Smoking status: Former Smoker    Packs/day: 0.50    Types: Cigarettes    Start date: 04/10/2016    Quit date: 04/10/2016    Years since quitting: 4.0  . Smokeless tobacco: Former Clinical biochemist  . Vaping Use: Never used  Substance and Sexual Activity  . Alcohol use: No    Alcohol/week: 0.0 standard drinks  . Drug use: No    Types: Marijuana    Comment: last used about a month and half ago  . Sexual activity: Yes    Partners: Female    Birth control/protection: None  Other Topics Concern  . Not on file  Social History Narrative  . Not on file   Social Determinants of Health   Financial Resource Strain: Not on file  Food Insecurity: Not on file  Transportation Needs: Not on file  Physical Activity: Not on file  Stress: Not on file  Social Connections: Not on file    Allergies: No Known Allergies  Metabolic Disorder Labs: No results found for: HGBA1C, MPG No results found for: PROLACTIN No results found for: CHOL, TRIG, HDL, CHOLHDL, VLDL, LDLCALC No results found for: TSH  Therapeutic Level Labs: No results found for: LITHIUM No results found for: VALPROATE No components found for:  CBMZ  Current Medications: Current Outpatient Medications  Medication Sig Dispense  Refill  . benazepril (LOTENSIN) 10 MG tablet Take 10 mg by mouth daily.    . rosuvastatin (CRESTOR) 5 MG tablet   1   No current facility-administered medications for this visit.     Musculoskeletal:  Psychiatric Specialty Exam:  Review of Systems none reported  There were no vitals taken for this visit.There is no height or weight on file to calculate BMI.  General Appearance: well groomed   Eye Contact:  Good   Speech:  Normal Rate- not pressured or loud   Volume:  Normal  Mood:  Currently euthymic, at this time does not present depressed nor irritable/expansive   Affect:  Appropriate- full range,  no irritability or expansiveness   Thought Process:  Linear and Descriptions of Associations: Intact  Orientation:  Full (Time, Place, and Person)  Thought Content: No hallucinations, no delusions  No grandiose ideations   Suicidal Thoughts:  No denies suicidal or self-injurious ideations  Homicidal Thoughts:  No denies  any violent ideations  Memory:  Recent and remote grossly intact  Judgement:  Other:  Present  Insight:  Fair  Psychomotor Activity:  No psychomotor agitation or restlessness   Concentration:  Concentration: Good and Attention Span: Good  Recall:  Good  Fund of Knowledge: Good  Language: Good  Akathisia:  Negative  Handed:  Right  AIMS (if indicated):   Assets:  Desire for Improvement Financial Resources/Insurance Housing Resilience Social Support  ADL's:  Intact  Cognition: WNL- scored 30/30 on MSE today  Sleep:  Good- reports sleeping 7 hours per night   Screenings: ECT-MADRS   Flowsheet Row ECT Treatment from [facility]/05/2015 in [facility]  MADRS Total Score 33    Mini-Mental   Flowsheet Row ECT Treatment from 05/05/2015 in [facility]  Total Score (max 30 points ) 30       Assessment and Plan:   See above .  Psychiatric history reviewed and potential risks of recurrence reviewed . At  this time prefers not to resume psychiatric medication management . No current medications prescribed .  Have reviewed cannabis use disorder using motivational interviewing techniques and currently appears to be in a more contemplative stage of change , able to identify some negatives relating to using cannabis. Not interested in referrals to substance use disorder treatment programs at present.   Have reviewed potential for substance induced mood disorder and other potential negative mental health impact from cannabis.  Next appointment in one month, agrees to contact clinic sooner if any concern or worsening prior  Plans to go to couples therapy with wife .   Craige Cotta, MD 04/19/2020, 4:04 PM

## 2020-05-17 ENCOUNTER — Encounter (HOSPITAL_COMMUNITY): Payer: Self-pay | Admitting: Psychiatry

## 2020-05-17 ENCOUNTER — Other Ambulatory Visit: Payer: Self-pay

## 2020-05-17 ENCOUNTER — Ambulatory Visit (INDEPENDENT_AMBULATORY_CARE_PROVIDER_SITE_OTHER): Payer: Medicare Other | Admitting: Psychiatry

## 2020-05-17 DIAGNOSIS — F3342 Major depressive disorder, recurrent, in full remission: Secondary | ICD-10-CM

## 2020-05-17 NOTE — Progress Notes (Signed)
BH MD/PA/NP OP Progress Note  05/17/2020 3:50 PM Jose Perez  MRN:  [id number]  Chief Complaint: Medication management appointment HPI:   Duration 35 minutes   He reports he has been doing well . 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  He continues to endorse frequent but not daily cannabis use . Denies alcohol or other drug use . He does not feel cannabis has caused any significant health problems or untoward events but does acknowledge it has caused marital conflict , particularly as his wife is in long term recovery, very active in [place], and openly disapproves of his cannabis use .  He denies any hypomanic or manic symptoms. States he is sleeping well, denies reckless or impulsive behaviors, denies irritability , denies hypersexuality, and does not present with restlessness, with pressured or loud speech or with irritable , expansive affect . No grandiose ideations noted .   As noted in prior notes, he made decision to stop antidepressant ( Effexor XR , which he had been taking for many years ) about 7-8 months ago. States he feels he has been stable off the antidepressant and has not noted any worsening mood or neuro-vegetative symptoms or any worsening anxiety.  Currently presents euthymic, with appropriate/full range of affect. Denies anhedonia, denies sadness, denies changes in sleep, appetite or energy level .     Visit Diagnosis: MDD by history, Cannabis Use Disorder Past Psychiatric History:   Past Medical History:  Past Medical History:  Diagnosis Date  . Cannabis dependence (HCC) 03/13/2014  . Depression   . Hypertension 05/19/14    Past Surgical  History:  Procedure Laterality Date  . NO PAST SURGERIES      Family Psychiatric History:   Family History:  Family History  Problem Relation Age of Onset  . Depression Sister   . Schizophrenia Sister   . Heart attack Father     Social History:  Social History   Socioeconomic History  . Marital status: Married    Spouse name: Not on file  . Number of children: Not on file  . Years of education: Not on file  . Highest education level: Not on file  Occupational History  . Not on file  Tobacco Use  . Smoking status: Former Smoker    Packs/day: 0.50    Types: Cigarettes    Start date: 04/10/2016    Quit date: 04/10/2016    Years since quitting: 4.1  . Smokeless tobacco: Former Clinical biochemist  . Vaping Use: Never used  Substance and Sexual Activity  . Alcohol use: No    Alcohol/week: 0.0 standard drinks  . Drug use: No    Types: Marijuana    Comment: last used about a month and half ago  . Sexual activity: Yes    Partners: Female    Birth control/protection: None  Other Topics Concern  . Not on file  Social History Narrative  . Not on file   Social Determinants of Health   Financial Resource Strain: Not on file  Food Insecurity: Not on file  Transportation Needs: Not on file  Physical Activity: Not on file  Stress: Not on file  Social Connections: Not  on file    Allergies: No Known Allergies  Metabolic Disorder Labs: No results found for: HGBA1C, MPG No results found for: PROLACTIN No results found for: CHOL, TRIG, HDL, CHOLHDL, VLDL, LDLCALC No results found for: TSH  Therapeutic Level Labs: No results found for: LITHIUM No results found for: VALPROATE No components found for:  CBMZ  Current Medications: Current Outpatient Medications  Medication Sig Dispense Refill  . benazepril (LOTENSIN) 10 MG tablet Take 10 mg by mouth daily.    . rosuvastatin (CRESTOR) 5 MG tablet   1   No current facility-administered medications for this visit.      Musculoskeletal:  Psychiatric Specialty Exam:  Review of Systems none reported  There were no vitals taken for this visit.There is no height or weight on file to calculate BMI.  General Appearance: well groomed   Eye Contact:  Good   Speech:  Normal Rate-  Volume:  Normal  Mood:  Currently euthymic, at this time does not present depressed nor irritable/expansive   Affect:  Appropriate- reactive   Thought Process:  Linear and Descriptions of Associations: Intact  Orientation:  Full (Time, Place, and Person)  Thought Content: No hallucinations, no delusions  No grandiose ideations   Suicidal Thoughts:  No denies suicidal or self-injurious ideations  Homicidal Thoughts:  No denies any violent ideations  Memory:  Recent and remote grossly intact  Judgement:  Other:  Present  Insight:  Fair  Psychomotor Activity:  No psychomotor agitation or restlessness   Concentration:  Concentration: Good and Attention Span: Good  Recall:  Good  Fund of Knowledge: Good  Language: Good  Akathisia:  Negative  Handed:  Right  AIMS (if indicated):   Assets:  Desire for Improvement Financial Resources/Insurance Housing Resilience Social Support  ADL's:  Intact  Cognition: WNL- scored 30/30 on MSE today  Sleep:  Good- reports sleeping 7 hours per night   Screenings: ECT-MADRS   Flowsheet Row ECT Treatment from [facility]05/2015 [facility]  MADRS Total Score 33    Mini-Mental   Flowsheet Row ECT Treatment from 05/05/2015 in [facility]  Total Score (max 30 points ) 30       Assessment and Plan:    See above .  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 He has a history of MDD, with past history of psychiatric admissions for severe depressive episode, most recently  16 +  years ago at which time received ECT due to severity of his depression .  He had been stable on Effexor XR , which  he took for more than 10 years, but he decided to stop medications last year , and has not had any worsening depression or depressive episodes since being off meds thus far.  He has a history of Cannabis Use Disorder since young adulthood  , but had an extensive period of sobriety lasting several years , at which time was being monitored by PHP/ Medical Board. 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 With his relapse into cannabis use there has been increased family tension particularly with wife and with one of his daughters both of whom have history of substance use disorder and who  are now sober and very active in [place].  The question of possible hypomanic  episodes  has been raised, based on episodes of increased irritability, anger , impulsivity which wife  has attributed to cannabis . (Of note, patient has no reported previous history of bipolarity). He has denied hypomania or mania and tended to attribute his episodes of  irritability to family conflict as explained above ( states he does become irritable and defensive when his wife focuses on his cannabis use and demands him to stop smoking ) . He has also  acknowledged past isolated /uncommon episodes of increased irritability while smoking cannabis which he has attributed to cannabis he used at the time likely being laced with unknown substance.   During visits he has not exhibited with pressured speech/expansive or irritable affect,  thought disorder, any  grandiosity, and he has not  endorsed  changes in sleep/ energy level.   Today presents euthymic, with unremarkable MSE   Utilizing motivational interviewing/ non confrontational techniques, I  have reviewed the increased risk of mood instability/ family disruption associated with cannabis use , and have encouraged /recommended abstinence and offered referrals for substance abuse treatment.   Have reviewed medication options - at this time he states he is feeling  well off psychiatric medications and does not want to resume.  He is  aware of potentially increased risk of recurrent depression off antidepressant.   I have reviewed option of neuropsychological testing ( presently does not feel this is necessary) and have recommended couples therapy, which they are currently in the process of initiating .   Although not currently on psychiatric medications , he states he  does want to continue seeing me for the time being , and states that these visits help him and also  contribute to improved family dynamics .   He agrees to contact clinic and go to ED if needed if any worsening .   Next appt in 4 weeks .  Currently on no standing psychiatric medications .       Craige Cotta, MD 05/17/2020, 3:50 PM

## 2020-06-14 ENCOUNTER — Other Ambulatory Visit: Payer: Self-pay

## 2020-06-14 ENCOUNTER — Ambulatory Visit (INDEPENDENT_AMBULATORY_CARE_PROVIDER_SITE_OTHER): Payer: Medicare Other | Admitting: Psychiatry

## 2020-06-14 DIAGNOSIS — F3342 Major depressive disorder, recurrent, in full remission: Secondary | ICD-10-CM

## 2020-06-14 NOTE — Progress Notes (Signed)
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 He denies feeling depressed or having neurovegetative symptoms.  Denies anhedonia, denies any suicidal ideations. He is sleeping well. He reports his relationship with his wife and young adult daughter has improved.  Attributes this to a series of factors to include that he is smoking cannabis less often, that there has been an unspoken "truce" with regards to their concerns about his cannabis use, and to increase focus on other issues such as selling their home, fixing up their new home, and upcoming trip.  His wife's brother passed away recently in [place], and they are traveling later this week to attend funeral and deal with estate issues.  Later this year has a planned trip to [place] with his wife and is also looking forward to upcoming major league baseball season ( he is a fan of [employer] and tries to see their games as often as he can) .   At this time he is not presenting with symptoms of hypomania or mania.  As noted, he is reports normal sleep.  His demeanor is calm without any psychomotor agitation or restlessness.  No thought disorder is noted.  His affect is appropriate and not expansive or irritable.  No grandiose ideations are expressed.  He denies any impulsive or reckless behaviors.  He is not currently taking any psychiatric medications.  As noted in prior visits, he stopped Effexor XR (which he had been on for a number of years) several months ago.  He states that he is wanting management without psychotropic medications at this time but does state that if he should become depressed again he would resume antidepressant medication.  Today he spoke about his ambivalence regarding telling his wife that he has not been taking his psychiatric  medication, which she has not done yet.  He is concerned that she will be worried and irritated.  He is aware of potentially increased risk of recurrence of depression off medication.  However, as he points out, he has now been off Effexor for close to a year without any recurrence of depression.  Using nonconfrontational/motivational interviewing techniques we have reviewed issues regarding cannabis use disorder -today I noticed him to be more introspective and verbal /less defensive overall ,regarding his history of cannabis abuse.  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  He also feels that at times during social interactions "it is like I have nothing to say", and there is a history of some social anxiety symptoms dating back to young adulthood.  He states that when he uses cannabis these insecurities improve and he feels more self assured , although he states that he is generally confident even when not using.Marland Kitchen  He does have insight regarding the clearly negative impact that cannabis use has had on his family dynamics, primarily on his wife and youngest daughter, who both have a history of alcohol use disorder and are both in sobriety and very involved in [place].  He states he has been  using cannabis less frequently and notes that his family dynamics have improved correspondingly. Of note, no alcohol or other drug abuse noted or reported.     Visit Diagnosis: MDD by history, Cannabis Use Disorder Past Psychiatric History:   Past Medical History:  Past Medical History:  Diagnosis Date  . Cannabis dependence (HCC) 03/13/2014  . Depression   . Hypertension 05/19/14    Past Surgical History:  Procedure  Laterality Date  . NO PAST SURGERIES      Family Psychiatric History:   Family History:  Family History  Problem Relation Age of Onset  . Depression Sister   . Schizophrenia Sister   . Heart attack Father     Social History:  Social History   Socioeconomic History  . Marital status: Married    Spouse name: Not on file  . Number of children: Not on file  . Years of education: Not on file  . Highest education level: Not on file  Occupational History  . Not on file  Tobacco Use  . Smoking status: Former Smoker    Packs/day: 0.50    Types: Cigarettes    Start date: 04/10/2016    Quit date: 04/10/2016    Years since quitting: 4.1  . Smokeless tobacco: Former Clinical biochemist  . Vaping Use: Never used  Substance and Sexual Activity  . Alcohol use: No    Alcohol/week: 0.0 standard drinks  . Drug use: No    Types: Marijuana    Comment: last used about a month and half ago  . Sexual activity: Yes    Partners: Female    Birth control/protection: None  Other Topics Concern  . Not on file  Social History Narrative  . Not on file   Social Determinants of Health   Financial Resource Strain: Not on file  Food Insecurity: Not on file  Transportation Needs: Not on file  Physical Activity: Not on file  Stress: Not on file  Social Connections: Not on file    Allergies: No Known Allergies  Metabolic Disorder Labs: No results found for: HGBA1C, MPG No results found for: PROLACTIN No results found for: CHOL, TRIG, HDL, CHOLHDL, VLDL, LDLCALC No results found for: TSH  Therapeutic Level Labs: No results found for: LITHIUM No results found for: VALPROATE No components found for:  CBMZ  Current Medications: Current Outpatient Medications  Medication Sig Dispense Refill  . benazepril (LOTENSIN) 10 MG tablet Take 10 mg by mouth daily.    . rosuvastatin (CRESTOR) 5 MG tablet   1   No current facility-administered medications for this visit.      Musculoskeletal:  Psychiatric Specialty Exam:  Review of Systems none reported  There were no vitals taken for this visit.There is no height or weight on file to calculate BMI.  General Appearance: well groomed   Eye Contact:  Good   Speech:  Normal Rate-no pressured speech  Volume:  Normal  Mood:  Currently euthymic,  Affect:  Appropriate- reactive , not irritable or expansive  Thought Process:  Linear and Descriptions of Associations: Intact  Orientation:  Full (Time, Place, and Person)  Thought Content: No hallucinations, no delusions  No grandiose ideations   Suicidal Thoughts:  No denies suicidal or self-injurious ideations  Homicidal Thoughts:  No denies any violent ideations  Memory:  Recent and remote grossly intact  Judgement:  Other:  Present  Insight:  Fair/improving  Psychomotor Activity:  No psychomotor agitation or restlessness   Concentration:  Concentration:  Good and Attention Span: Good  Recall:  Good  Fund of Knowledge: Good  Language: Good  Akathisia:  Negative  Handed:  Right  AIMS (if indicated):   Assets:  Desire for Improvement Financial Resources/Insurance Housing Resilience Social Support  ADL's:  Intact  Cognition: WNL- scored 30/30 on MSE today  Sleep:  Good- reports sleeping 7 hours per night   Screenings: ECT-MADRS   Flowsheet Row ECT [facility]05/2015 [facility]  MADRS Total Score 33    Mini-Mental   Flowsheet Row ECT Treatment from 05/05/2015 in [facility]  Total Score (max 30 points ) 30       Assessment and Plan:    Patient reports he is doing well and is currently functioning well in his daily activities.  Reports he has decreased his overall cannabis use and describes a correspondingly improving relationship with his wife and youngest adult daughter. He is off psychiatric medications and stopped Effexor XR several months ago.  He is not wanting to  restart any psychiatric medication at this time, which I have reviewed with him.  He expresses understanding of the potential increased risk of recurrence off antidepressants, as he has had well-documented episodes of severe major depression in the past. No current presentation or indication of mania or hypomania and presents euthymic.  In fact, today appears more introspective and willing to discuss psychodynamic issues which he feels may be contributors to his cannabis use disorder.   We have reviewed goals of treatment.  As noted he is not currently on any psychiatric medications.   He does state he finds these sessions helpful in order to continue monitoring his mood, affect and continue to discuss issues above and provide overall support.    He is also aware that my recommendation is for abstinence from cannabis, based on family concerns that it has, at least on some occasions caused mood alterations and because his drug use has caused significant stressors, primarily marital and family related.  We will see in 2 months, he agrees to contact clinic sooner should there be any worsening prior.  No psychiatric medications are prescribed at this time.       Craige Cotta, MD 06/14/2020, 5:02 PM

## 2020-07-12 ENCOUNTER — Telehealth (INDEPENDENT_AMBULATORY_CARE_PROVIDER_SITE_OTHER): Payer: Medicare Other | Admitting: Psychiatry

## 2020-07-12 ENCOUNTER — Other Ambulatory Visit: Payer: Self-pay

## 2020-07-12 DIAGNOSIS — F3342 Major depressive disorder, recurrent, in full remission: Secondary | ICD-10-CM

## 2020-07-12 NOTE — Progress Notes (Signed)
BH MD/PA/NP OP Progress Note  07/12/2020 3:35 PM Jose Perez  MRN:  [id number]  Chief Complaint: Medication management appointment HPI:   Phone based appointment - limitations related to this mode of communication have been considered and reviewed. Patient's identity has been confirmed using two separate identifiers .   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 Denies depression, denies increased anxiety, and states his mood has been stable. Denies having neuro-vegetative symptoms of depression, and presents euthymic, with a full range of affect .Denies difficulties with sleep and states has been sleeping well .  He reports that his relationship with both his wife and his adult children has improved . His wife came on the phone during a portion of today's session and corroborated that their relationship has improved and described there has been more " stability" at home recently .   They report that they have been quite busy recently, selling their home and working on completing their new home, which has  had to have parts of it rebuilt to meet code , dealing with wife's brother estate ( he recently passed ) , and other activities . He is looking forward to travelling to [place] soon in order to go to a [employer] and they are also planning a trip to [place] later this year .  He does not currently endorse or present with symptoms of hypomania or mania .   With regards to cannabis, states he has been smoking less than before, " maybe twice a week". Denies alcohol or any other drug use .  He is not taking any psychiatric medications at present. As noted in prior notes, he reports he decided to come off psychiatric medications several months ago. We have reviewed potential risks associated with this such as increased risk of mood episode/depression decompensation . He has expressed understanding of this risk . Currently states he is doing well and does present  euthymic .  No SI, future oriented . No psychotic symptoms.      Visit Diagnosis: MDD by history, Cannabis Use Disorder Past Psychiatric History:   Past Medical History:  Past Medical History:  Diagnosis Date  . Cannabis dependence (HCC) 03/13/2014  . Depression   . Hypertension 05/19/14    Past Surgical History:  Procedure Laterality Date  . NO PAST SURGERIES      Family Psychiatric History:   Family History:  Family History  Problem Relation Age of Onset  . Depression Sister   . Schizophrenia Sister   . Heart attack Father     Social History:  Social History   Socioeconomic History  . Marital status: Married    Spouse name: Not on file  . Number of children: Not on file  . Years of education: Not on file  . Highest education level: Not on file  Occupational History  . Not on file  Tobacco Use  . Smoking status: Former Smoker    Packs/day: 0.50    Types: Cigarettes    Start date: 04/10/2016    Quit date: 04/10/2016    Years since quitting: 4.2  . Smokeless tobacco: Former Clinical biochemist  . Vaping Use: Never used  Substance and Sexual Activity  . Alcohol use: No    Alcohol/week: 0.0 standard drinks  . Drug use: No    Types: Marijuana    Comment: last used about a month and half ago  . Sexual activity: Yes  Partners: Female    Birth control/protection: None  Other Topics Concern  . Not on file  Social History Narrative  . Not on file   Social Determinants of Health   Financial Resource Strain: Not on file  Food Insecurity: Not on file  Transportation Needs: Not on file  Physical Activity: Not on file  Stress: Not on file  Social Connections: Not on file    Allergies: No Known Allergies  Metabolic Disorder Labs: No results found for: HGBA1C, MPG No results found for: PROLACTIN No results found for: CHOL, TRIG, HDL, CHOLHDL, VLDL, LDLCALC No results found for: TSH  Therapeutic Level Labs: No results found for: LITHIUM No results  found for: VALPROATE No components found for:  CBMZ  Current Medications: Current Outpatient Medications  Medication Sig Dispense Refill  . benazepril (LOTENSIN) 10 MG tablet Take 10 mg by mouth daily.    . rosuvastatin (CRESTOR) 5 MG tablet   1   No current facility-administered medications for this visit.     Musculoskeletal:  Psychiatric Specialty Exam: please note inherent difficulties in obtaining a full MSE in the context of phone communication Review of Systems none reported  There were no vitals taken for this visit.There is no height or weight on file to calculate BMI.  General Appearance: NA   Eye Contact:  NA   Speech:  Normal Rate-no pressured speech  Volume:  Normal  Mood:  Euthymic. No evidence of hypomania or mania   Affect:  Appropriate  Thought Process:  Linear and Descriptions of Associations: Intact  Orientation:  Full (Time, Place, and Person)  Thought Content: No hallucinations, no delusions    Suicidal Thoughts:  No denies suicidal or self-injurious ideations  Homicidal Thoughts:  No no  violent ideations  Memory:  Recent and remote grossly intact  Judgement:  Other:  Present  Insight:  Fair/improving  Psychomotor Activity:  No psychomotor agitation or restlessness   Concentration:  Concentration: Good and Attention Span: Good  Recall:  Good  Fund of Knowledge: Good  Language: Good  Akathisia:  Negative  Handed:  Right  AIMS (if indicated):   Assets:  Desire for Improvement Financial Resources/Insurance Housing Resilience Social Support  ADL's:  Intact  Cognition: WNL- scored 30/30 on MSE today  Sleep:  Good- reports sleeping well    Screenings: ECT-MADRS   Flowsheet Row ECT Treatment from [facility]/05/2015 in [facility]  MADRS Total Score 33    Mini-Mental   Flowsheet Row ECT Treatment from 05[facility]05/2015 in [facility]  Total Score (max 30 points ) 30       Assessment and Plan:     Currently he reports doing well and presents euthymic, with a full range of affect . He is functioning well in daily activities and presents future oriented , without anhedonia, looking forward to planned trips to [place] and to [place] later this year. There is no report of or current presentation suggestive of hypomania - sleeping well, speech not pressured, no grandiose ideations, no expansive or irritable affect .  His wife corroborates that there has been improvement and that their relationship has improved , with less tension between them recently ( which had been attributed in part to cannabis abuse ).  He reports he continues to use cannabis, but states he has gradually cut down frequency. No ETOH or other drug use  endorsed .  He is not taking any psychiatric medications at present which we have  reviewed during sessions. Currently prefers to not resume psychiatric medications , understands potential increased risk of decompensation /recurrence of depression off meds. He does ask to continue seeing me periodically for assessment and support even if no medications are prescribed .   Will see in 4-6 weeks, agrees to contact clinic sooner if there is any worsening prior.  No medications prescribed by me at this time          Craige Cotta, MD 07/12/2020, 3:35 PM

## 2020-08-09 ENCOUNTER — Other Ambulatory Visit: Payer: Self-pay

## 2020-08-09 ENCOUNTER — Encounter (HOSPITAL_COMMUNITY): Payer: Self-pay | Admitting: Psychiatry

## 2020-08-09 ENCOUNTER — Telehealth (INDEPENDENT_AMBULATORY_CARE_PROVIDER_SITE_OTHER): Payer: Medicare Other | Admitting: Psychiatry

## 2020-08-09 DIAGNOSIS — F3342 Major depressive disorder, recurrent, in full remission: Secondary | ICD-10-CM | POA: Diagnosis not present

## 2020-08-09 NOTE — Progress Notes (Addendum)
BH MD/PA/NP OP Progress Note  08/09/2020 3:03 PM Jose Perez  MRN:  [id number]  Chief Complaint: Medication management appointment HPI:   Phone based appointment - limitations related to this mode of communication have been considered and reviewed. Patient's identity has been confirmed using two separate identifiers .   Duration 25 minutes   Location of parties-patient currently traveling with family.  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 His wife was also on the phone and provided information. Currently they are in [place]. Wife's brother , who resided in [place] , passed away several weeks ago, so they traveled there to help with the estate, etc. Once that was completed they have been travelling to different [place] , including going to the [place] . They are now gradually making their way back to [place] as they have a wedding to attend in several days. Currently patient reports he is doing well and denies feeling depressed . He denies neuro-vegetative symptoms of depression, and reports normal sleep, appetite and energy level . Denies any hypomanic type symptoms or anger/irritability/recklessness.  Wife corroborates , states " he has actually been doing pretty good" and states she feels he is back to his " normal self " and that they are enjoying their time together. They also report his relationship with his children, particularly his youngest daughter , which had been tense and distant in the past, has improved significantly, which family attributes to patient being improved and less irritable.  Patient is vague about cannabis consumption, although states has not recently used . Wife states " I am sure he is still using here and there ". No other drug or alcohol abuse reported .  As noted, he has made decision to stop psychiatric medications . Based on today's meeting it appears he has yet  not informed his wife , as she asked him about medications  during this assessment . Patient stated that he would talk to her about being off medications and explain rationale.  No SI, future oriented .       Visit Diagnosis: MDD by history, Cannabis Use Disorder Past Psychiatric History:   Past Medical History:  Past Medical History:  Diagnosis Date  . Cannabis dependence (HCC) 03/13/2014  . Depression   . Hypertension 05/19/14    Past Surgical History:  Procedure Laterality Date  . NO PAST SURGERIES      Family Psychiatric History:   Family History:  Family History  Problem Relation Age of Onset  . Depression Sister   . Schizophrenia Sister   . Heart attack Father     Social History:  Social History   Socioeconomic History  . Marital status: Married    Spouse name: Not on file  . Number of children: Not on file  . Years of education: Not on file  . Highest education level: Not on file  Occupational History  . Not on file  Tobacco Use  . Smoking status: Former Smoker    Packs/day: 0.50    Types: Cigarettes    Start date: 04/10/2016    Quit date: 04/10/2016    Years since quitting: 4.3  . Smokeless tobacco: Former Clinical biochemist  . Vaping Use: Never used  Substance and Sexual Activity  . Alcohol use: No    Alcohol/week: 0.0 standard drinks  . Drug use: No    Types: Marijuana    Comment: last used about a month and half ago  .  Sexual activity: Yes    Partners: Female    Birth control/protection: None  Other Topics Concern  . Not on file  Social History Narrative  . Not on file   Social Determinants of Health   Financial Resource Strain: Not on file  Food Insecurity: Not on file  Transportation Needs: Not on file  Physical Activity: Not on file  Stress: Not on file  Social Connections: Not on file    Allergies: No Known Allergies  Metabolic Disorder Labs: No results found for: HGBA1C, MPG No results found for: PROLACTIN No results found for: CHOL, TRIG, HDL, CHOLHDL, VLDL, LDLCALC No results  found for: TSH  Therapeutic Level Labs: No results found for: LITHIUM No results found for: VALPROATE No components found for:  CBMZ  Current Medications: Current Outpatient Medications  Medication Sig Dispense Refill  . benazepril (LOTENSIN) 10 MG tablet Take 10 mg by mouth daily.    . rosuvastatin (CRESTOR) 5 MG tablet   1   No current facility-administered medications for this visit.     Musculoskeletal:  Psychiatric Specialty Exam: please note inherent difficulties in obtaining a full MSE in the context of phone communication Review of Systems none reported  There were no vitals taken for this visit.There is no height or weight on file to calculate BMI.  General Appearance: NA   Eye Contact:  NA   Speech:  Normal Rate-no pressured speech  Volume:  Normal  Mood:  Euthymic. No evidence of hypomania or mania   Affect:  Appropriate/ full range of affect, not expansive or irritable   Thought Process:  Linear and Descriptions of Associations: Intact  Orientation:  Full (Time, Place, and Person)  Thought Content: No hallucinations, no delusions    Suicidal Thoughts:  No denies suicidal or self-injurious ideations  Homicidal Thoughts:  No no  violent ideations  Memory:  Recent and remote grossly intact  Judgement:  Other:  Present  Insight:  Fair/improving  Psychomotor Activity:  NA  Concentration:  Concentration: Good and Attention Span: Good  Recall:  Good  Fund of Knowledge: Good  Language: Good  Akathisia:  Negative  Handed:  Right  AIMS (if indicated):   Assets:  Desire for Improvement Financial Resources/Insurance Housing Resilience Social Support  ADL's:  Intact  Cognition: WNL  Sleep:  Good- reports sleeping well    Screenings: ECT-MADRS   Flowsheet Row ECT Treatment from [facility]05/2015 in [facility]  MADRS Total Score 33    Mini-Mental   Flowsheet Row ECT Treatment from 05/05/2015 in [facility]  Total Score (max 30 points ) 30       Assessment and Plan:    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 He is currently retired and has been travelling often. At this time he is in [place] with his wife and states he recently visited the [facility]. Wife was present during today's phone visit and corroborates patient presents stable and reports his relationship with her and their children has improved . He reports he has cut down on cannabis use but is vague as to date of last use . His wife states she believes he is still smoking cannabis intermittently but overall this issue seems much less of a point of contention or major stressor today. No symptoms of mania or hypomania reported /noted , and also reports he is sleeping well .  As reviewed in prior notes, he decided to stop psychiatric medications last year . This was not due to side effects but rather to feeling he no longer needed to be on antidepressant ( was on Effexor for many years). He does have a well documented history of past episodes of MDD, and I have reviewed potentially increased risk of exacerbation or recurrence of mood episode/depression off medications .  He appears more insightful regarding how cannabis was likely a contributor to episodes of irritability/behavior changes and to family tension, and states he is using cannabis less often.  He has asked that I continue seeing him periodically to help assess mood , feels that maintaining appointments assist with stability.   Will see in 6-8 weeks, agrees to contact clinic sooner if there is any worsening prior.  No medications prescribed by me at this time          Craige Cotta, MD 08/09/2020, 3:03 PM

## 2020-09-07 ENCOUNTER — Other Ambulatory Visit: Payer: Self-pay

## 2020-09-07 ENCOUNTER — Telehealth (INDEPENDENT_AMBULATORY_CARE_PROVIDER_SITE_OTHER): Payer: Medicare Other | Admitting: Psychiatry

## 2020-09-07 DIAGNOSIS — F3342 Major depressive disorder, recurrent, in full remission: Secondary | ICD-10-CM

## 2020-09-07 NOTE — Progress Notes (Signed)
BH MD/PA/NP OP Progress Note  09/07/2020 2:38 PM Jose Perez  MRN:  [id number]  Chief Complaint: Medication management appointment HPI:   Phone based appointment - limitations related to this mode of communication have been considered and reviewed. Patient's identity has been confirmed using two separate identifiers .   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 His wife was also on the phone and provided information.   He reports he has been doing well .  Denies depression or significant anxiety.  Denies significant neuro-vegetative symptoms and sleep, appetite, energy level characterized as normal.   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  His wife corroborates that he has been " a lot better" and she points out that he has not been irritable or on edge. 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   One current stressor is that their son in law, currently in his late 51s, is in the ED with acute onset chest pain.   He reports cannabis consumption has continued to decrease but has not stopped altogether. He states he uses this substance  " sometimes" but not regularly .   As noted in prior visits, he is no longer taking psychiatric medications and made decision to stop them a few months  ago. Possible increased risk of recurrence of depression ( has history of recurrent MDD) has been reviewed on several occasions.            Visit Diagnosis: MDD by history, Cannabis Use Disorder Past Psychiatric History:   Past Medical History:  Past Medical History:  Diagnosis Date  . Cannabis dependence (HCC) 03/13/2014  . Depression   . Hypertension 05/19/14    Past Surgical History:  Procedure Laterality Date  . NO PAST SURGERIES      Family Psychiatric History:   Family History:  Family History  Problem Relation Age of Onset  . Depression Sister   . Schizophrenia Sister   . Heart attack Father     Social History:  Social History   Socioeconomic History  . Marital status: Married    Spouse name: Not on file  . Number of children: Not on file  . Years of education: Not on file  . Highest education level: Not on file  Occupational History  . Not on file  Tobacco Use  . Smoking status: Former Smoker    Packs/day: 0.50    Types: Cigarettes    Start date: 04/10/2016    Quit date: 04/10/2016    Years since quitting: 4.4  . Smokeless tobacco: Former Clinical biochemist  . Vaping Use: Never used  Substance and Sexual Activity  . Alcohol use: No    Alcohol/week: 0.0 standard drinks  . Drug use: No  Types: Marijuana    Comment: last used about a month and half ago  . Sexual activity: Yes    Partners: Female    Birth control/protection: None  Other Topics Concern  . Not on file  Social History Narrative  . Not on file   Social Determinants of Health   Financial Resource Strain: Not on file  Food Insecurity: Not on file  Transportation Needs: Not on file  Physical Activity: Not on file  Stress: Not on file  Social Connections: Not on file    Allergies: No Known Allergies  Metabolic Disorder Labs: No results found for: HGBA1C, MPG No results found for: PROLACTIN No results found for: CHOL, TRIG, HDL, CHOLHDL, VLDL, LDLCALC No results found for:  TSH  Therapeutic Level Labs: No results found for: LITHIUM No results found for: VALPROATE No components found for:  CBMZ  Current Medications: Current Outpatient Medications  Medication Sig Dispense Refill  . benazepril (LOTENSIN) 10 MG tablet Take 10 mg by mouth daily.    . rosuvastatin (CRESTOR) 5 MG tablet   1   No current facility-administered medications for this visit.     Musculoskeletal:  Psychiatric Specialty Exam: please note inherent difficulties in obtaining a full MSE in the context of phone communication Review of Systems none reported  There were no vitals taken for this visit.There is no height or weight on file to calculate BMI.  General Appearance: NA   Eye Contact:  NA   Speech:  Normal Rate  Volume:  Normal  Mood:  Euthymic.   Affect:  Appropriate/ full range  Thought Process:  Linear and Descriptions of Associations: Intact  Orientation:  Full (Time, Place, and Person)  Thought Content: No hallucinations, no delusions    Suicidal Thoughts:  No denies suicidal or self-injurious ideations  Homicidal Thoughts:  No no  violent ideations  Memory:  Recent and remote grossly intact  Judgement:  Other:  Present  Insight:  Fair/improving  Psychomotor Activity:  NA  Concentration:  Concentration: Good and Attention Span: Good  Recall:  Good  Fund of Knowledge: Good  Language: Good  Akathisia:  Negative  Handed:  Right  AIMS (if indicated):   Assets:  Desire for Improvement Financial Resources/Insurance Housing Resilience Social Support  ADL's:  Intact  Cognition: WNL  Sleep:  Good- reports sleeping well    Screenings: ECT-MADRS   Flowsheet Row ECT Treatment [facility]05/2015 in [facility]  MADRS Total Score 33    Mini-Mental   Flowsheet Row ECT Treatment from 05/05/2015 in [facility]  Total Score (max 30 points ) 30       Assessment and Plan:    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 He states he has been enjoying his retirement and travelling with wife . No manic or hypomanic symptoms are noted or endorsed . Wife corroborates that he is doing well and is at baseline, currently managing stressors well,  and that their marital relationship has improved in this context . He is somewhat vague about cannabis use. States he is using less often than before but unclear when last used . Wife suspects he continues to use intermittently. No other drug or alcohol use reported or suspected .  He is currently retired and has been travelling often. At this time he is in [place] with his wife and states  he recently visited the [facility] Wife was present during today's phone visit and corroborates patient presents stable and reports his relationship with her and their children has improved . He reports he has cut down on cannabis use but is vague as to date of last use . His wife states she believes he is still smoking cannabis intermittently but overall this issue seems much less of a point of contention or major stressor today. No symptoms of mania or hypomania reported /noted , and also reports he is sleeping well .  As reviewed in prior notes, he decided to stop psychiatric medications last year . This was not due to side effects but rather to feeling he no longer needed to be on antidepressant ( was on Effexor for many years). . Possible increased risk of affective /depresion episode recurrence or recurrence of social anxiety symptoms he had when younger have been reviewed and he acknowledges them but states that he feels he is doing well at this time and does not want to restart meds currently.  He has asked to continue seeing me periodically for review and states he finds these sessions help him .   Will see in 6-8 weeks, agrees to contact clinic promptly if there is any worsening  prior.  No medications prescribed by me at this time          Craige Cotta, MD 09/07/2020, 2:38 PM

## 2020-09-20 ENCOUNTER — Telehealth (HOSPITAL_COMMUNITY): Payer: Medicare Other | Admitting: Psychiatry

## 2020-10-18 ENCOUNTER — Other Ambulatory Visit: Payer: Self-pay

## 2020-10-18 ENCOUNTER — Encounter (HOSPITAL_COMMUNITY): Payer: Self-pay | Admitting: Psychiatry

## 2020-10-18 ENCOUNTER — Telehealth (INDEPENDENT_AMBULATORY_CARE_PROVIDER_SITE_OTHER): Payer: Medicare Other | Admitting: Psychiatry

## 2020-10-18 DIAGNOSIS — F3342 Major depressive disorder, recurrent, in full remission: Secondary | ICD-10-CM

## 2020-10-18 NOTE — Progress Notes (Signed)
BH MD/PA/NP OP Progress Note  10/18/2020 5:50 PM Jose Perez  MRN:  [id number]  Chief Complaint: Medication management appointment HPI:   Phone based appointment - limitations related to this mode of communication have been considered and reviewed. Patient's identity has been confirmed using two separate identifiers .   Duration 25 minutes   Location of parties-home  Provider- clinic office   At his request wife was also on the phone and provided information  Currently reports he is doing well.  He describes mood as stable, and denies neurovegetative symptoms.  Sleep, appetite, energy level described as normal.  No anhedonia.  Similarly, denies (and does not present with) symptoms of hypomania or mania. He retired about 2 years ago and since then has been doing more frequent traveling, mainly to [place] to visit his son, [place] to visit his daughter and also to work on his parents estate, and more recently to [place] to work on his brother in law"s (who passed earlier this year) estate.  He normally travels with his wife.  In the context of cannabis abuse and described intermittent mood changes associated with cannabis use there had been increased marital tension.  However this has improved significantly and both patient and wife describe improved and stabilized relationship .  His wife reports that he is now his "usual self" and that they have " fun together".   They are now about to go on vacation to [place] in a week or so.  They state that after they returned they are planning on "settling down" and traveling less.  There are some ongoing stressors, primarily regarding housing.  They are in the process of selling their old house and are building a new 1 but they have been significant problems with construction/inspection process, resulting in delays and extra costs.  Patient describes however, that he is taking the stressor "in stride" without letting it upset him or get in the way  of his enjoyment of retirement.  As noted in previous notes patient decided to stop his psychiatric medications several months ago.  We have discussed the risks associated with being off medications and the potential increased risk of recurrence of depression.  Thus far he remains euthymic.  We reviewed restarting medications but at this time his preference is to continue to be off them.  An issue that we have reviewed that he has apparently not told his wife that he is off medications.  He states he has wanted to but acknowledges that he is concerned she will disagree with his decision.  With regards to cannabis use it is unclear how often he is using but describes using less frequently and less often than before.  During the session there is no evidence of any intoxication or intoxicated behavior.           Visit Diagnosis: MDD by history, Cannabis Use Disorder Past Psychiatric History:   Past Medical History:  Past Medical History:  Diagnosis Date   Cannabis dependence (HCC) 03/13/2014   Depression    Hypertension 05/19/14    Past Surgical History:  Procedure Laterality Date   NO PAST SURGERIES      Family Psychiatric History:   Family History:  Family History  Problem Relation Age of Onset   Depression Sister    Schizophrenia Sister    Heart attack Father     Social History:  Social History   Socioeconomic History   Marital status: Married    Spouse name: Not on  file   Number of children: Not on file   Years of education: Not on file   Highest education level: Not on file  Occupational History   Not on file  Tobacco Use   Smoking status: Former    Packs/day: 0.50    Pack years: 0.00    Types: Cigarettes    Start date: 04/09/2016    Quit date: 04/10/2016    Years since quitting: 4.5   Smokeless tobacco: Former  Building services engineer Use: Never used  Substance and Sexual Activity   Alcohol use: No    Alcohol/week: 0.0 standard drinks   Drug use: No     Types: Marijuana    Comment: last used about a month and half ago   Sexual activity: Yes    Partners: Female    Birth control/protection: None  Other Topics Concern   Not on file  Social History Narrative   Not on file   Social Determinants of Health   Financial Resource Strain: Not on file  Food Insecurity: Not on file  Transportation Needs: Not on file  Physical Activity: Not on file  Stress: Not on file  Social Connections: Not on file    Allergies: No Known Allergies  Metabolic Disorder Labs: No results found for: HGBA1C, MPG No results found for: PROLACTIN No results found for: CHOL, TRIG, HDL, CHOLHDL, VLDL, LDLCALC No results found for: TSH  Therapeutic Level Labs: No results found for: LITHIUM No results found for: VALPROATE No components found for:  CBMZ  Current Medications: Current Outpatient Medications  Medication Sig Dispense Refill   benazepril (LOTENSIN) 10 MG tablet Take 10 mg by mouth daily.     rosuvastatin (CRESTOR) 5 MG tablet   1   No current facility-administered medications for this visit.     Musculoskeletal:  Psychiatric Specialty Exam: please note inherent difficulties in obtaining a full MSE in the context of phone communication Review of Systems none reported  There were no vitals taken for this visit.There is no height or weight on file to calculate BMI.  General Appearance: NA   Eye Contact:  NA   Speech:  Normal Rate  Volume:  Normal  Mood:  Euthymic.   Affect:  Appropriate/ full range, without irritability or expansiveness  Thought Process:  Linear and Descriptions of Associations: Intact  Orientation:  Full (Time, Place, and Person)  Thought Content:  No hallucinations, no delusions     Suicidal Thoughts:  No denies suicidal or self-injurious ideations  Homicidal Thoughts:  No no  violent ideations  Memory:   Recent and remote grossly intact  Judgement:  Other:  Present  Insight:  Fair/improving  Psychomotor Activity:  NA   Concentration:  Concentration: Good and Attention Span: Good  Recall:  Good  Fund of Knowledge: Good  Language: Good  Akathisia:  Negative  Handed:  Right  AIMS (if indicated):   Assets:  Desire for Improvement Financial Resources/Insurance Housing Resilience Social Support  ADL's:  Intact  Cognition: WNL  Sleep:  Good- reports sleeping well    Screenings: ECT-MADRS    Flowsheet Row ECT Treatment from 05/05/2015 in [facility]  MADRS Total Score 33      Mini-Mental    Flowsheet Row ECT Treatment from 05/05/2015 in [facility]  Total Score (max 30 points ) 30        Assessment and Plan:    70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 Currently doing well and presented in euthymic.  Improved marital relationship.  Currently traveling along with family often. During this assessment endorses stable mood, presents euthymic.  Wife corroborates that he is stable and doing well. As noted above he has decided to be off medications and stopped Effexor XR several months ago (had been on Effexor XR for years).  We have reviewed potential increased risk of mood episode recurrence off medications.  He is aware of risk. At this time report is that cannabis use has decreased/become less frequent.  During this assessment there is no evidence of intoxication and he presents euthymic and at baseline.  As noted in prior notes he has requested to continue seeing me in spite of not being on medications.  He f describes hat the ability to check in with me regularly and review progress/stressors supports his mood stability and provides him with a sense of security in case there is any mood change Will see in 6-8 weeks, agrees to contact clinic promptly if there is any worsening prior.  No medications prescribed by me at this  time          Craige Cotta, MD 10/18/2020, 5:50 PM

## 2020-11-01 ENCOUNTER — Telehealth (HOSPITAL_COMMUNITY): Payer: Medicare Other | Admitting: Psychiatry

## 2021-01-10 ENCOUNTER — Telehealth (HOSPITAL_COMMUNITY): Payer: Medicare Other | Admitting: Psychiatry

## 2021-01-24 ENCOUNTER — Other Ambulatory Visit: Payer: Self-pay

## 2021-01-24 ENCOUNTER — Telehealth (HOSPITAL_BASED_OUTPATIENT_CLINIC_OR_DEPARTMENT_OTHER): Payer: Medicare Other | Admitting: Psychiatry

## 2021-01-24 ENCOUNTER — Encounter (HOSPITAL_COMMUNITY): Payer: Self-pay | Admitting: Psychiatry

## 2021-01-24 DIAGNOSIS — F325 Major depressive disorder, single episode, in full remission: Secondary | ICD-10-CM

## 2021-01-24 NOTE — Progress Notes (Signed)
BH MD/PA/NP OP Progress Note  01/24/2021 6:32 PM GRANT SWAGER  MRN:  [id number]  Chief Complaint: Medication management appointment HPI:   Phone based appointment - limitations related to this mode of communication have been considered and reviewed. Patient's identity has been confirmed using two separate identifiers .   Duration 20 minutes   Location of parties-patient reports he is currently at his family home in [place] Provider- clinic office  Dr. 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 He states he is functioning well in his daily activities.  At this time he is in [place] ([place] originally from [place], and after his mother's death he has been visiting periodically to oversee work on the family home, visit his adult daughter, who lives there , and go to baseball games )  He states he is concerned about his daughter, Doy Hutching, who is in her mid 58s , has a history of depression and alcohol use disorder, because she has continued to struggle with depression and he wanted to make sure she was doing all right.  Their relationship has been tense in the past but currently is improved.  He states that she is currently seeing a psychiatrist in [place] and hopes that she will start feeling better soon. He describes improved relationship with his children, particularly with Colleen, and with his wife, and describes current family life is stable. As noted he opted to stop antidepressant/psychiatric medications several months ago, and is currently not taking any psychiatric medication.  He reports his mood has remained stable and euthymic ,denies any significant neurovegetative symptoms, denies anhedonia and states he is enjoying his retirement, denies having any suicidal ideations. He has a past history of cannabis use disorder, in the past family has linked episodes of increased  cannabis use with decompensation.  He states he is smoking "very little" at this time but acknowledges that he is not completely abstinent. Denies alcohol or other drug abuse . We have again reviewed the potential increased risk of mood disorder/recurrence of severe depression based on his past history of major depression.  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           Visit Diagnosis: MDD by history, Cannabis Use Disorder Past Psychiatric History:   Past Medical History:  Past Medical History:  Diagnosis Date   Cannabis dependence (HCC) 03/13/2014   Depression    Hypertension 05/19/14    Past Surgical History:  Procedure Laterality Date   NO PAST SURGERIES      Family Psychiatric History:   Family History:  Family History  Problem Relation Age of Onset   Depression Sister    Schizophrenia Sister    Heart attack Father     Social History:  Social History   Socioeconomic History   Marital status: Married    Spouse name: Not on file   Number of children: Not on file   Years of education: Not on file   Highest education level: Not on file  Occupational History   Not on file  Tobacco Use   Smoking status: Former    Packs/day: 0.50    Types: Cigarettes    Start date: 04/09/2016    Quit date: 04/10/2016    Years since quitting: 4.7   Smokeless tobacco: Former  Advertising account planner  Vaping Use: Never used  Substance and Sexual Activity   Alcohol use: No    Alcohol/week: 0.0 standard drinks   Drug use: No    Types: Marijuana    Comment: last used about a month and half ago   Sexual activity: Yes    Partners: Female    Birth control/protection: None  Other Topics Concern   Not on file  Social History Narrative   Not on file   Social Determinants of Health   Financial Resource Strain: Not on file  Food Insecurity: Not on file  Transportation  Needs: Not on file  Physical Activity: Not on file  Stress: Not on file  Social Connections: Not on file    Allergies: No Known Allergies  Metabolic Disorder Labs: No results found for: HGBA1C, MPG No results found for: PROLACTIN No results found for: CHOL, TRIG, HDL, CHOLHDL, VLDL, LDLCALC No results found for: TSH  Therapeutic Level Labs: No results found for: LITHIUM No results found for: VALPROATE No components found for:  CBMZ  Current Medications: Current Outpatient Medications  Medication Sig Dispense Refill   benazepril (LOTENSIN) 10 MG tablet Take 10 mg by mouth daily.     rosuvastatin (CRESTOR) 5 MG tablet   1   No current facility-administered medications for this visit.     Musculoskeletal:  Psychiatric Specialty Exam: please note inherent difficulties in obtaining a full MSE in the context of phone communication Review of Systems none reported  There were no vitals taken for this visit.There is no height or weight on file to calculate BMI.  General Appearance: NA   Eye Contact:  NA   Speech:  Normal Rate  Volume:  Normal  Mood:  Euthymic.  Denies feeling depressed.  Affect presents full in range.  No irritability or expansiveness  Affect:  Appropriate/ full range, without irritability or expansiveness  Thought Process:  Linear and Descriptions of Associations: Intact  Orientation:  Full (Time, Place, and Person)  Thought Content:  No hallucinations, no delusions     Suicidal Thoughts:  No denies suicidal or self-injurious ideations, future oriented  Homicidal Thoughts:  No no  violent or homicidal ideations  Memory:   Recent and remote grossly intact  Judgement:  Other:  Present  Insight:  Fair/improving  Psychomotor Activity:  NA  Concentration:  Concentration: Good and Attention Span: Good  Recall:  Good  Fund of Knowledge: Good  Language: Good  Akathisia:  Negative  Handed:  Right  AIMS (if indicated):   Assets:  Desire for  Improvement Financial Resources/Insurance Housing Resilience Social Support  ADL's:  Intact  Cognition: WNL  Sleep:  Good- reports sleeping well    Screenings: ECT-MADRS    Flowsheet Row ECT Treatment from 05/05/2015 in [facility]  MADRS Total Score 33      Mini-Mental    Flowsheet Row ECT Treatment from 05/05/2015 in [facility]  Total Score (max 30 points ) 30        Assessment and Plan:    70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   Reports he is doing well.  Enjoying retired life and traveling often.  Currently in [place] visiting his daughter who has unfortunately been struggling with depression.  States he is doing well and denies depression or neurovegetative symptoms/presents euthymic.  Reports he is using cannabis less frequently  and less amounts been in the past but acknowledges he is not abstinent.  We have reviewed potential risks associated with cannabis use and increased risk of depression recurrence/exacerbation psychiatric medications.  His current preference continues  to be off medications but he understands above and states he will contact clinic if he notices any emerging depression    No medications prescribed by me at this time Will see in 6 weeks          Craige Cotta, MD 01/24/2021, 6:32 PMPatient ID: Lynnette Caffey, male   DOB: [birth year], 70 y.o.   MRN: [id number]

## 2021-03-07 ENCOUNTER — Encounter (HOSPITAL_COMMUNITY): Payer: Self-pay | Admitting: Psychiatry

## 2021-03-07 ENCOUNTER — Other Ambulatory Visit: Payer: Self-pay

## 2021-03-07 ENCOUNTER — Telehealth (HOSPITAL_BASED_OUTPATIENT_CLINIC_OR_DEPARTMENT_OTHER): Payer: Medicare Other | Admitting: Psychiatry

## 2021-03-07 DIAGNOSIS — F325 Major depressive disorder, single episode, in full remission: Secondary | ICD-10-CM

## 2021-03-07 NOTE — Progress Notes (Signed)
BH MD/PA/NP OP Progress Note  03/07/2021 2:13 PM Jose Perez  MRN:  [id number]  Chief Complaint: Medication management appointment HPI:   Phone based appointment - limitations related to this mode of communication have been considered and reviewed. Patient's identity has been confirmed using two separate identifiers .   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 He states there have been several positive issues recently. 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 States this has been a significant relief . His adult son had been out of a job for a few weeks to months but was recently hired and doing well . They ( him and his wife) finally sold their old house and [place] and this allows them to more readily /fully move into their new home.  States  that although he has been enjoying travelling ( has gone to [place] on vacation , to [place] , where he is from, as he has baseball season tickets there and to see his daughter, to [place] to visit his son) he is ready to finalize moving into their new home and " just enjoying it , staying in one place for a while". He reports relationship with his daughter Jill Side has improved . He is concerned because she has been struggling with depression but states she is seeing a psychiatrist and on medication now.  His relationship with wife is also described as improved , stable .  Denies depression, denies neuro-vegetative symptoms, denies any suicidal ideations and presents future oriented . Presents euthymic.  Denies /does not  endorse recent cannabis use .   Not currently on any standing psychiatric medication           Visit Diagnosis: MDD by history, Cannabis Use Disorder Past Psychiatric History:   Past Medical History:  Past Medical  History:  Diagnosis Date   Cannabis dependence (HCC) 03/13/2014   Depression    Hypertension 05/19/14    Past Surgical History:  Procedure Laterality Date   NO PAST SURGERIES      Family Psychiatric History:   Family History:  Family History  Problem Relation Age of Onset   Depression Sister    Schizophrenia Sister    Heart attack Father     Social History:  Social History   Socioeconomic History   Marital status: Married    Spouse name: Not on file   Number of children: Not on file   Years of education: Not on file   Highest education level: Not on file  Occupational History   Not on file  Tobacco Use   Smoking status: Former    Packs/day: 0.50    Types: Cigarettes    Start date: 04/09/2016    Quit date: 04/10/2016    Years since quitting: 4.9   Smokeless tobacco: Former  Building services engineer Use: Never used  Substance and Sexual Activity   Alcohol use: No    Alcohol/week: 0.0 standard drinks   Drug use: No    Types: Marijuana    Comment: last used about a month and half ago   Sexual activity: Yes    Partners: Female    Birth control/protection: None  Other Topics Concern   Not on file  Social History Narrative   Not on  file   Social Determinants of Health   Financial Resource Strain: Not on file  Food Insecurity: Not on file  Transportation Needs: Not on file  Physical Activity: Not on file  Stress: Not on file  Social Connections: Not on file    Allergies: No Known Allergies  Metabolic Disorder Labs: No results found for: HGBA1C, MPG No results found for: PROLACTIN No results found for: CHOL, TRIG, HDL, CHOLHDL, VLDL, LDLCALC No results found for: TSH  Therapeutic Level Labs: No results found for: LITHIUM No results found for: VALPROATE No components found for:  CBMZ  Current Medications: Current Outpatient Medications  Medication Sig Dispense Refill   benazepril (LOTENSIN) 10 MG tablet Take 10 mg by mouth daily.     rosuvastatin  (CRESTOR) 5 MG tablet   1   No current facility-administered medications for this visit.     Musculoskeletal:  Psychiatric Specialty Exam: please note inherent difficulties in obtaining a full MSE in the context of phone communication Review of Systems none reported  There were no vitals taken for this visit.There is no height or weight on file to calculate BMI.  General Appearance: NA   Eye Contact:  NA   Speech:  Normal Rate  Volume:  Normal  Mood:  Euthymic. Reports mood stable, denies depression and affect presents full range and appropriate  Affect:  Appropriate/ no irritability or expansiveness  Thought Process:  Linear and Descriptions of Associations: Intact  Orientation:  Full (Time, Place, and Person)  Thought Content:  No hallucinations, no delusions     Suicidal Thoughts:  No denies suicidal or self-injurious ideations, future oriented  Homicidal Thoughts:  No no  violent or homicidal ideations  Memory:   Recent and remote grossly intact  Judgement:  Other:  Present  Insight:  Fair/improving  Psychomotor Activity:  NA  Concentration:  Concentration: Good and Attention Span: Good  Recall:  Good  Fund of Knowledge: Good  Language: Good  Akathisia:  Negative  Handed:  Right  AIMS (if indicated):   Assets:  Desire for Improvement Financial Resources/Insurance Housing Resilience Social Support  ADL's:  Intact  Cognition: WNL  Sleep:  Good- reports sleeping well    Screenings: ECT-MADRS    Flowsheet Row ECT Treatment from [facility]/05/2015 in [facility]  MADRS Total Score 33      Mini-Mental    Flowsheet Row ECT Treatment from 05/05/2015 in 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   Currently doing well  and reports he is functioning well in daily activities . He states he is happy that a lawsuit he had been involved in is now completed , that he has been able to sell his previous home , and that Jill Side, his daughter, is now on antidepressant and seeing a psychiatrist regularly.  He describes mood as stable and presents euthymic with a full range of affect. Future oriented , no SI, denies neuro-vegetative symptoms. States he has not recently used cannabis  His preference is not to start/take an antidepressant medication at this time. He has been off antidepressants for almost a year , wihthout any recurrence of MDD . He is aware of possible recurrent nature of MDD/severe depression, and he reports he  maintains vigilance and is alert to any changes in mood or emerging neurovegetative symptoms. His wife is also involved and supportive .   Dx  MDD by history, currently in remission Cannabis Use Disorder   No medications prescribed by me at this time He agrees to contact clinic sooner should there be any worsening or concern prior n Will see in 6 weeks          Craige Cotta, MD 03/07/2021, 2:13 PM  Patient ID: Lynnette Caffey, male   DOB: 1951/01/11, 70 y.o.   MRN: [id number]

## 2021-04-18 ENCOUNTER — Encounter (HOSPITAL_COMMUNITY): Payer: Self-pay | Admitting: Psychiatry

## 2021-04-18 ENCOUNTER — Other Ambulatory Visit: Payer: Self-pay

## 2021-04-18 ENCOUNTER — Telehealth (HOSPITAL_BASED_OUTPATIENT_CLINIC_OR_DEPARTMENT_OTHER): Payer: Medicare Other | Admitting: Psychiatry

## 2021-04-18 DIAGNOSIS — F325 Major depressive disorder, single episode, in full remission: Secondary | ICD-10-CM | POA: Diagnosis not present

## 2021-04-18 NOTE — Progress Notes (Signed)
BH MD/PA/NP OP Progress Note  04/18/2021 1:14 PM Jose Perez  MRN:  [id number]  Chief Complaint: Medication management appointment HPI:   Phone based appointment - limitations related to this mode of communication have been considered and reviewed. Patient's identity has been confirmed using two separate identifiers .   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  He reports he has been doing well in daily activities and has been enjoying time spent with family and friends. After his retirement a couple of years ago, he has had frequent trips /vacations and has been on the road often, visiting his children who live out of state , travelling . States that his wife and him are now back in their new home and " settling in" and plan to spend more time at home, " taking it it easy" which he is looking forward to .  Family /social stressors he had been facing are reported as improving , more stable . Reports the following improvements .  He has a history of intermittently tense and difficult relationship with his daughter Jill Side. Fortunately their relationship has significantly improved , and he states she is planning to come visit them later this week.  He had been involved in some litigation with his MD partners which has now been solved /completed . Their new home has now been completed and they have been able to move in .  He also reports stable/good relationship with his wife .   History of cannabis use disorder . Reports he has not used recently and states he has not thought about using cannabis as often .  Denies depression and presents euthymic. No neuro vegetative symptoms reported .  Not currently on any standing psychiatric medication           Visit Diagnosis: MDD by history, Cannabis Use Disorder Past Psychiatric History:   Past Medical History:  Past Medical History:   Diagnosis Date   Cannabis dependence (HCC) 03/13/2014   Depression    Hypertension 05/19/14    Past Surgical History:  Procedure Laterality Date   NO PAST SURGERIES      Family Psychiatric History:   Family History:  Family History  Problem Relation Age of Onset   Depression Sister    Schizophrenia Sister    Heart attack Father     Social History:  Social History   Socioeconomic History   Marital status: Married    Spouse name: Not on file   Number of children: Not on file   Years of education: Not on file   Highest education level: Not on file  Occupational History   Not on file  Tobacco Use   Smoking status: Former    Packs/day: 0.50    Types: Cigarettes    Start date: 04/09/2016    Quit date: 04/10/2016    Years since quitting: 5.0   Smokeless tobacco: Former  Building services engineer Use: Never used  Substance and Sexual Activity   Alcohol use: No    Alcohol/week: 0.0 standard drinks   Drug use: No    Types: Marijuana    Comment: last used about a month and half ago   Sexual activity: Yes    Partners: Female    Birth control/protection: None  Other Topics Concern   Not on file  Social History Narrative   Not on file   Social  Determinants of Health   Financial Resource Strain: Not on file  Food Insecurity: Not on file  Transportation Needs: Not on file  Physical Activity: Not on file  Stress: Not on file  Social Connections: Not on file    Allergies: No Known Allergies  Metabolic Disorder Labs: No results found for: HGBA1C, MPG No results found for: PROLACTIN No results found for: CHOL, TRIG, HDL, CHOLHDL, VLDL, LDLCALC No results found for: TSH  Therapeutic Level Labs: No results found for: LITHIUM No results found for: VALPROATE No components found for:  CBMZ  Current Medications: Current Outpatient Medications  Medication Sig Dispense Refill   benazepril (LOTENSIN) 10 MG tablet Take 10 mg by mouth daily.     rosuvastatin (CRESTOR) 5 MG  tablet   1   No current facility-administered medications for this visit.     Musculoskeletal:  Psychiatric Specialty Exam: please note inherent difficulties in obtaining a full MSE in the context of phone communication Review of Systems none reported  There were no vitals taken for this visit.There is no height or weight on file to calculate BMI.  General Appearance: NA   Eye Contact:  NA   Speech:  Normal Rate  Volume:  Normal  Mood:  Euthymic. Mood stable   Affect:  Appropriate/ reactive   Thought Process:  Linear and Descriptions of Associations: Intact  Orientation:  Full (Time, Place, and Person)  Thought Content:  No hallucinations, no delusions     Suicidal Thoughts:  No denies suicidal or self-injurious ideations, future oriented  Homicidal Thoughts:  No no  violent or homicidal ideations  Memory:   Recent and remote grossly intact  Judgement:  Other:  Present  Insight:  Fair/improving  Psychomotor Activity:  NA  Concentration:  Concentration: Good and Attention Span: Good  Recall:  Good  Fund of Knowledge: Good  Language: Good  Akathisia:  Negative  Handed:  Right  AIMS (if indicated):   Assets:  Desire for Improvement Financial Resources/Insurance Housing Resilience Social Support  ADL's:  Intact  Cognition: WNL  Sleep:  Good- reports sleeping well    Screenings: ECT-MADRS    Flowsheet Row ECT Treatment [facility]/05/2015 in [facility]  MADRS Total Score 33      Mini-Mental    Flowsheet Row ECT Treatment from 05/05/2015 in [facility]  Total Score (max 30 points ) 30        Assessment and Plan:    71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   Currently reports he is doing well . Functioning well in daily activities . Reports improved/stable family relationships . States he  is looking forward to settling down in their new house and anticipates to be travelling less this year . Presents euthymic . Reports has not recently used cannabis and also states he has had less thoughts of using substance . As noted, not currently on any antidepressant or other psychiatric medication. We have reviewed the potential for increased risk of recurrence of major depression off meds. He expresses understanding.    Dx  MDD by history, currently in remission Cannabis Use Disorder   No medications prescribed by me at this time He agrees to contact clinic sooner should there be any worsening or concern prior to next appt Will see in 6 weeks  Craige CottaFernando A Lemonte Al, MD 04/18/2021, 1:14 PM  Patient ID: Lynnette Caffeyhomas A Kirwan, male   DOB: 01-09-1951, 71 y.o.   MRN: [id number]  Patient ID: Lynnette Caffeyhomas A Sells, male   DOB: 01-09-1951, 71 y.o.   MRN: [id number]

## 2021-06-27 ENCOUNTER — Encounter (HOSPITAL_COMMUNITY): Payer: Self-pay | Admitting: Psychiatry

## 2021-06-27 ENCOUNTER — Other Ambulatory Visit: Payer: Self-pay

## 2021-06-27 ENCOUNTER — Telehealth (HOSPITAL_BASED_OUTPATIENT_CLINIC_OR_DEPARTMENT_OTHER): Payer: Medicare Other | Admitting: Psychiatry

## 2021-06-27 DIAGNOSIS — F325 Major depressive disorder, single episode, in full remission: Secondary | ICD-10-CM

## 2021-06-27 NOTE — Progress Notes (Signed)
BH MD/PA/NP OP Progress Note ? ?06/27/2021 5:12 PM ?Jose Perez  ?MRN:  [id number] ? ?Chief Complaint: Medication management appointment ?HPI:  ? ?Phone based appointment - limitations related to this mode of communication have been considered and reviewed. Patient's identity has been confirmed using two separate identifiers .  ? ?Duration 25 minutes  ? ?Location of parties ?Patient - home  ?Provider- clinic office ? 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 ? ?He states he is functioning well in daily activities, enjoying retirement. ? ?States his mood has been stable, denies depression or significant neurovegetative symptoms and appears euthymic with a full range of affect.  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 ?He reports he feels some of the stressors he had been experiencing have fortunately resolved.  He had a lawsuit pending when he retired which has now finally been resolved, there was some delay and difficulties in the construction of his new home which have been addressed.  He does worry about his youngest daughter, currently in her 71s, who has a history of depression and alcohol use disorder, lives in [place]. ? ?He states his relationship with his wife has been stable without significant marital tension. ? ?He reports he recently told his wife that he had stopped psychiatric medications and that at this time he is not taking any antidepressant /psychiatric medication.  At his request she came on the phone as well along with him. * It was noted that he apparently had told her or she had understood that I had tapered him off the antidepressant.  Reviewed that it was his decision to stop medication about a year ago.  ? ?She does corroborate that his mood has generally been stable and that he has been doing well.  He continues to smoke cannabis with some regularity but apparently this has not been an ongoing point  of contention between them any longer.  She does state she worries about it though.  She reports their current focus is on their new home and on supporting their daughter as above. ? ?Reviewed potential risks associated with being off psychiatric medications.  Patient has a well-documented history of severe depression in the past-is aware of potential for depression to recur.  He states, however, that in the context of being retired/with less stressors in his life potential likelihood for recurrence of depression is less.  His preference is to continue to be off antidepressants.  He acknowledges risks.  He states that in the event that he starts feeling sad or depressed he would promptly resume Effexor or another antidepressant. ? ?Provided support / reviewed coping skills and ego strengths .  ? ? ? ? ? ? ? ? ? ? ? ? ? ?Visit Diagnosis: MDD by history, Cannabis Use Disorder ?Past Psychiatric History:  ? ?Past Medical History:  ?Past Medical History:  ?Diagnosis Date  ? Cannabis dependence (HCC) 03/13/2014  ? Depression   ? Hypertension 05/19/14  ?  ?Past Surgical History:  ?Procedure Laterality Date  ? NO PAST SURGERIES    ? ? ?Family Psychiatric History:  ? ?Family History:  ?Family History  ?Problem Relation Age of Onset  ? Depression Sister   ? Schizophrenia Sister   ? Heart attack Father   ? ? ?Social History:  ?Social History  ? ?Socioeconomic History  ? Marital status: Married  ?  Spouse name: Not on file  ?  Number of children: Not on file  ? Years of education: Not on file  ? Highest education level: Not on file  ?Occupational History  ? Not on file  ?Tobacco Use  ? Smoking status: Former  ?  Packs/day: 0.50  ?  Types: Cigarettes  ?  Start date: 04/09/2016  ?  Quit date: 04/10/2016  ?  Years since quitting: 5.2  ? Smokeless tobacco: Former  ?Vaping Use  ? Vaping Use: Never used  ?Substance and Sexual Activity  ? Alcohol use: No  ?  Alcohol/week: 0.0 standard drinks  ? Drug use: No  ?  Types: Marijuana  ?   Comment: last used about a month and half ago  ? Sexual activity: Yes  ?  Partners: Female  ?  Birth control/protection: None  ?Other Topics Concern  ? Not on file  ?Social History Narrative  ? Not on file  ? ?Social Determinants of Health  ? ?Financial Resource Strain: Not on file  ?Food Insecurity: Not on file  ?Transportation Needs: Not on file  ?Physical Activity: Not on file  ?Stress: Not on file  ?Social Connections: Not on file  ? ? ?Allergies: No Known Allergies ? ?Metabolic Disorder Labs: ?No results found for: HGBA1C, MPG ?No results found for: PROLACTIN ?No results found for: CHOL, TRIG, HDL, CHOLHDL, VLDL, LDLCALC ?No results found for: TSH ? ?Therapeutic Level Labs: ?No results found for: LITHIUM ?No results found for: VALPROATE ?No components found for:  CBMZ ? ?Current Medications: ?Current Outpatient Medications  ?Medication Sig Dispense Refill  ? benazepril (LOTENSIN) 10 MG tablet Take 10 mg by mouth daily.    ? rosuvastatin (CRESTOR) 5 MG tablet   1  ? ?No current facility-administered medications for this visit.  ? ? ? ?Musculoskeletal: ? ?Psychiatric Specialty Exam: please note inherent difficulties in obtaining a full MSE in the context of phone communication ?Review of Systems none reported  ?There were no vitals taken for this visit.There is no height or weight on file to calculate BMI.  ?General Appearance: NA   ?Eye Contact:  NA   ?Speech:  Normal Rate  ?Volume:  Normal  ?Mood:  Euthymic. Mood stable   ?Affect:  Appropriate/ reactive   ?Thought Process:  Linear and Descriptions of Associations: Intact  ?Orientation:  Full (Time, Place, and Person)  ?Thought Content:  No hallucinations, no delusions     ?Suicidal Thoughts:  No denies suicidal or self-injurious ideations, future oriented  ?Homicidal Thoughts:  No no  violent or homicidal ideations  ?Memory:   Recent and remote grossly intact  ?Judgement:  Other:  Present  ?Insight:  Fair/improving  ?Psychomotor Activity:  NA  ?Concentration:   Concentration: Good and Attention Span: Good  ?Recall:  Good  ?Fund of Knowledge: Good  ?Language: Good  ?Akathisia:  Negative  ?Handed:  Right  ?AIMS (if indicated):   ?Assets:  Desire for Improvement ?Financial Resources/Insurance ?Housing ?Resilience ?Social Support  ?ADL's:  Intact  ?Cognition: WNL  ?Sleep:  Good- reports sleeping well   ? ?Screenings: ?ECT-MADRS   ? ?Flowsheet Row ECT Treatment [facility] 05/05/2015 in [facility]  ?MADRS Total Score 33  ? ?  ? ?Mini-Mental   ? ?Flowsheet Row ECT Treatment from 05/05/2015 in [facility]  ?Total Score (max 30 points ) 30  ? ?  ? ? ? ?Assessment and Plan:  ?  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 ? ? 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  Presents euthymic, denies neurovegetative symptoms, states he is enjoying his retirement, maintaining an active lifestyle.  He continues to use cannabis regularly, states he gets it from vetted/legal source/store to insure that it is not laced or contaminated with any other substance , which had been a concern in the past .  Denies having any negative effect from cannabis at this time.  He has now been off psychiatric medication/antidepressant for more than a year.  His mood has remained stable, without recurrence of depressive symptoms thus far  ? ?Due to  history of depressive episodes in the past we have reviewed the potential increased risk of mood episode/recurrence off medications.  He is aware and understanding of these risks-his preference is to be off psychiatric medication at this time but states he would restart medication in case he becomes depressed in the future.  We have also reviewed the possible negative impact that cannabis use could have on physical/mental health. ? ?He had been hesitant to inform his wife about being off psychiatric  medications.  He did recently tell her.  We reviewed as above.  She expressed understanding. ? ?He is interested in continuing appointments periodically, states "I think they are really helpful for me".  Next ap

## 2021-08-09 ENCOUNTER — Ambulatory Visit (HOSPITAL_BASED_OUTPATIENT_CLINIC_OR_DEPARTMENT_OTHER): Payer: Medicare Other | Admitting: Psychiatry

## 2021-08-09 ENCOUNTER — Encounter (HOSPITAL_COMMUNITY): Payer: Self-pay | Admitting: Psychiatry

## 2021-08-09 DIAGNOSIS — F325 Major depressive disorder, single episode, in full remission: Secondary | ICD-10-CM

## 2021-08-09 NOTE — Progress Notes (Signed)
BH MD/PA/NP OP Progress Note ? ?08/09/2021 1:09 PM ?Clovis Puhomas A Kampa  ?MRN:  [id number] ? ?Chief Complaint: Medication management appointment ?HPI:  ? ?Phone based appointment - limitations related to this mode of communication have been considered and reviewed. Patient's identity has been confirmed using two separate identifiers .  ? ?Duration 25 minutes  ? ?Location of parties ?Patient - home  ?Provider- clinic office ? ?Dr Leroy Libmaniszek reports he is doing well . ? ?He states he is functioning well in daily activities, enjoying retirement. ? ?States his mood has been stable, denies depression or significant neurovegetative symptoms and appears euthymic with a full range of affect.  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 ?He reports he feels some of the stressors he had been experiencing have fortunately resolved.  He had a lawsuit pending when he retired which has now finally been resolved, there was some delay and difficulties in the construction of his new home which have been addressed.  He does worry about his youngest daughter, currently in her 1420s, who has a history of depression and alcohol use disorder, lives in [place]. ? ?He states his relationship with his wife has been stable without significant marital tension. ? ?He reports he recently told his wife that he had stopped psychiatric medications and that at this time he is not taking any antidepressant /psychiatric medication.  At his request she came on the phone as well along with him. * It was noted that he apparently had told her or she had understood that I had tapered him off the antidepressant.  Reviewed that it was his decision to stop medication about a year ago.  ? ?She does corroborate that his mood has generally been stable and that he has been doing well.  He continues to smoke cannabis with some regularity but apparently this has not been an ongoing point  of contention between them any longer.  She does state she worries about it though.  She reports their current focus is on their new home and on supporting their daughter as above. ? ?Reviewed potential risks associated with being off psychiatric medications.  Patient has a well-documented history of severe depression in the past-is aware of potential for depression to recur.  He states, however, that in the context of being retired/with less stressors in his life potential likelihood for recurrence of depression is less.  His preference is to continue to be off antidepressants.  He acknowledges risks.  He states that in the event that he starts feeling sad or depressed he would promptly resume Effexor or another antidepressant. ? ?Provided support / reviewed coping skills and ego strengths .  ? ? ? ? ? ? ? ? ? ? ? ? ? ?Visit Diagnosis: MDD by history, Cannabis Use Disorder ?Past Psychiatric History:  ? ?Past Medical History:  ?Past Medical History:  ?Diagnosis Date  ?? Cannabis dependence (HCC) 03/13/2014  ?? Depression   ?? Hypertension 05/19/14  ?  ?Past Surgical History:  ?Procedure Laterality Date  ?? NO PAST SURGERIES    ? ? ?Family Psychiatric History:  ? ?Family History:  ?Family History  ?Problem Relation Age of Onset  ?? Depression Sister   ?? Schizophrenia Sister   ?? Heart attack Father   ? ? ?Social History:  ?Social History  ? ?Socioeconomic History  ?? Marital status: Married  ?  Spouse name: Not on file  ??  Number of children: Not on file  ?? Years of education: Not on file  ?? Highest education level: Not on file  ?Occupational History  ?? Not on file  ?Tobacco Use  ?? Smoking status: Former  ?  Packs/day: 0.50  ?  Types: Cigarettes  ?  Start date: 04/09/2016  ?  Quit date: 04/10/2016  ?  Years since quitting: 5.3  ?? Smokeless tobacco: Former  ?Vaping Use  ?? Vaping Use: Never used  ?Substance and Sexual Activity  ?? Alcohol use: No  ?  Alcohol/week: 0.0 standard drinks  ?? Drug use: No  ?  Types:  Marijuana  ?  Comment: last used about a month and half ago  ?? Sexual activity: Yes  ?  Partners: Female  ?  Birth control/protection: None  ?Other Topics Concern  ?? Not on file  ?Social History Narrative  ?? Not on file  ? ?Social Determinants of Health  ? ?Financial Resource Strain: Not on file  ?Food Insecurity: Not on file  ?Transportation Needs: Not on file  ?Physical Activity: Not on file  ?Stress: Not on file  ?Social Connections: Not on file  ? ? ?Allergies: No Known Allergies ? ?Metabolic Disorder Labs: ?No results found for: HGBA1C, MPG ?No results found for: PROLACTIN ?No results found for: CHOL, TRIG, HDL, CHOLHDL, VLDL, LDLCALC ?No results found for: TSH ? ?Therapeutic Level Labs: ?No results found for: LITHIUM ?No results found for: VALPROATE ?No components found for:  CBMZ ? ?Current Medications: ?Current Outpatient Medications  ?Medication Sig Dispense Refill  ?? benazepril (LOTENSIN) 10 MG tablet Take 10 mg by mouth daily.    ?? rosuvastatin (CRESTOR) 5 MG tablet   1  ? ?No current facility-administered medications for this visit.  ? ? ? ?Musculoskeletal: ? ?Psychiatric Specialty Exam: please note inherent difficulties in obtaining a full MSE in the context of phone communication ?Review of Systems none reported  ?There were no vitals taken for this visit.There is no height or weight on file to calculate BMI.  ?General Appearance: NA   ?Eye Contact:  NA   ?Speech:  Normal Rate  ?Volume:  Normal  ?Mood:  Euthymic. Mood stable   ?Affect:  Appropriate/ reactive   ?Thought Process:  Linear and Descriptions of Associations: Intact  ?Orientation:  Full (Time, Place, and Person)  ?Thought Content:  No hallucinations, no delusions     ?Suicidal Thoughts:  No denies suicidal or self-injurious ideations, future oriented  ?Homicidal Thoughts:  No no  violent or homicidal ideations  ?Memory:   Recent and remote grossly intact  ?Judgement:  Other:  Present  ?Insight:  Fair/improving  ?Psychomotor Activity:  NA   ?Concentration:  Concentration: Good and Attention Span: Good  ?Recall:  Good  ?Fund of Knowledge: Good  ?Language: Good  ?Akathisia:  Negative  ?Handed:  Right  ?AIMS (if indicated):   ?Assets:  Desire for Improvement ?Financial Resources/Insurance ?Housing ?Resilience ?Social Support  ?ADL's:  Intact  ?Cognition: WNL  ?Sleep:  Good- reports sleeping well   ? ?Screenings: ?ECT-MADRS   ? ?Flowsheet Row ECT Treatment [facility]/05/2015 in [facility]  ?MADRS Total Score 33  ? ?  ? ?Mini-Mental   ? ?Flowsheet Row ECT Treatment from 05/05/2015 in [facility]  ?Total Score (max 30 points ) 30  ? ?  ? ? ? ?Assessment and Plan:  ?  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 ? ? ?Currently Dr. [name] is doing well.  Presents euthymic, denies neurovegetative symptoms, states he is enjoying his retirement, maintaining an active lifestyle.  He continues to use cannabis regularly, states he gets it from vetted/legal source/store to insure that it is not laced or contaminated with any other substance , which had been a concern in the past .  Denies having any negative effect from cannabis at this time.  He has now been off psychiatric medication/antidepressant for more than a year.  His mood has remained stable, without recurrence of depressive symptoms thus far  ? ?Due to  history of depressive episodes in the past we have reviewed the potential increased risk of mood episode/recurrence off medications.  He is aware and understanding of these risks-his preference is to be off psychiatric medication at this time but states he would restart medication in case he becomes depressed in the future.  We have also reviewed the possible negative impact that cannabis use could have on physical/mental health. ? ?He had been hesitant to inform his wife about being off  psychiatric medications.  He did recently tell her.  We reviewed as above.  She expressed understanding. ? ?He is interested in continuing appointments periodically, states "I think they are really help

## 2021-08-09 NOTE — Progress Notes (Signed)
[facility] MD/PA/NP OP Progress Note ? ?08/09/2021 1:55 PM ?Jose Perez  ?MRN:  [id number] ? ?Chief Complaint: Medication management appointment ?HPI:  ? ?This was an in office, face to face appointment  ? ?Duration 30 minutes  ? ?Location of parties ?Patient - home  ?Provider- clinic office ? ?[name] reports he is doing well . ? ?He continues to report that he has been doing well in daily activities . He reports he is enjoying retirement , spending time with his wife ,  travelling, watching baseball, talking to his children. He reports that his mood has been stable and denies feeling depressed or sad or experiencing anhedonia recently . He presets euthymic, with a full range of affect .  ?Presents future oriented. Spoke for example of the White Sox  season so far , for which he has season tickets , and going to see additional games .  ?Reports his appetite and sleep are both within normal . ? ?He reports recent stressor, fortunately now improving - his youngest daughter now  in her lat 49s lives in [place] and has a history of depression and alcohol use disorder , had been living in a house he owns and had not been working and had been isolating at home. He states he is very happy that she has been better and recently found a job ? ? ?With regards to cannabis, reports he continues to smoke cannabis with regularity but not daily  , more often when he goes to [place] where he reports it is legal . He states that he has smoked cannabis regularly since college, with a period of several years of complete abstinence ( at which time time he was involved with [employer] [facility] [employer]) , with resumption of cannabis use when this program was completed and when he retired . At present reports he does not notice negative effects from cannabis , and does not feel it is negatively affecting his mood . Denies alcohol or other drug abuse . ? ?He thinks that episode of irritability/ anger  and manic type symptoms he developed several years ago  while on vacation in the [place] with his family likely resulted from smoking what he at the time thought was cannabis but could have been some other psychoactive medication because he remembers  "it felt very different" after using on that occasion than he normally feels . He states that subsequently he has had no mood elevation or mood changes  related to marihuana use . ? ?He does have a well documented history of depression, including having a severe episode of MDD years ago which required ECT . For years he took Effexor with good tolerance and response . He decided to stop Effexor and be off psychiatric medications completely and has been off medications for close to 2 years . 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 ? ?I have again counseled him regarding the possible increased risk of recurrence/ depression exacerbation in the context of being off antidepressant . He states his preference is to stay off antidepressants but does state he is aware of what the symptoms of depression are and how he has felt before and that " if I start getting depressed or feel like it's coming I will go  back on meds' . States he has the advantage that his wife is attuned to his mood and  presentation and would quickly identify a depression recurrence . ? ?He has requested that I see him periodically to assess.  ? ? ? ? ? ? ? ? ? ? ? ? ? ? ? ?Visit Diagnosis: MDD by history, Cannabis Use Disorder ?Past Psychiatric History:  ? ?Past Medical History:  ?Past Medical History:  ?Diagnosis Date  ? Cannabis dependence ([place]) 03/13/2014  ? Depression   ? Hypertension 05/19/14  ?  ?Past Surgical History:  ?Procedure Laterality Date  ? NO PAST SURGERIES    ? ? ?Family Psychiatric History:  ? ?Family History:  ?Family History  ?Problem Relation Age of Onset  ? Depression Sister   ? Schizophrenia Sister   ? Heart  attack Father   ? ? ?Social History:  ?Social History  ? ?Socioeconomic History  ? Marital status: Married  ?  Spouse name: Not on file  ? Number of children: Not on file  ? Years of education: Not on file  ? Highest education level: Not on file  ?Occupational History  ? Not on file  ?Tobacco Use  ? Smoking status: Former  ?  Packs/day: 0.50  ?  Types: Cigarettes  ?  Start date: 04/09/2016  ?  Quit date: 04/10/2016  ?  Years since quitting: 5.3  ? Smokeless tobacco: Former  ?Vaping Use  ? Vaping Use: Never used  ?Substance and Sexual Activity  ? Alcohol use: No  ?  Alcohol/week: 0.0 standard drinks  ? Drug use: No  ?  Types: Marijuana  ?  Comment: last used about a month and half ago  ? Sexual activity: Yes  ?  Partners: Female  ?  Birth control/protection: None  ?Other Topics Concern  ? Not on file  ?Social History Narrative  ? Not on file  ? ?Social Determinants of Health  ? ?Financial Resource Strain: Not on file  ?Food Insecurity: Not on file  ?Transportation Needs: Not on file  ?Physical Activity: Not on file  ?Stress: Not on file  ?Social Connections: Not on file  ? ? ?Allergies: No Known Allergies ? ?Metabolic Disorder Labs: ?No results found for: HGBA1C, MPG ?No results found for: PROLACTIN ?No results found for: CHOL, TRIG, HDL, CHOLHDL, VLDL, LDLCALC ?No results found for: TSH ? ?Therapeutic Level Labs: ?No results found for: LITHIUM ?No results found for: VALPROATE ?No components found for:  CBMZ ? ?Current Medications: ?Current Outpatient Medications  ?Medication Sig Dispense Refill  ? benazepril (LOTENSIN) 10 MG tablet Take 10 mg by mouth daily.    ? rosuvastatin (CRESTOR) 5 MG tablet   1  ? ?No current facility-administered medications for this visit.  ? ? ? ?Musculoskeletal: ? ?Psychiatric Specialty Exam:  ?Review of Systems none reported  ?There were no vitals taken for this visit.There is no height or weight on file to calculate BMI.  ?General Appearance: casually groomed, no psychomotor agitation or  restlessness, calm, pleasant on approach  ?Eye Contact:  good eye contact   ?Speech:  Normal Rate  ?Volume:  Normal  ?Mood:  Euthymic. Mood stable   ?Affect:  Appropriate/ reactive   ?Thought Process:  Linear and Descriptions of Associations: Intact  ?Orientation:  Full (Time, Place, and Person)  ?Thought Content:  No hallucinations, no delusions     ?Suicidal Thoughts:  No denies suicidal or self-injurious ideations, future oriented  ?Homicidal Thoughts:  No no  violent or homicidal ideations  ?Memory:   Recent and remote grossly intact  ?Judgement:  Other:  Present  ?Insight:  Fair/improving  ?Psychomotor Activity:  NA  ?Concentration:  Concentration: Good and Attention Span: Good  ?Recall:  Good  ?Fund of Knowledge: Good  ?Language: Good  ?Akathisia:  Negative  ?Handed:  Right  ?AIMS (if indicated):   ?Assets:  Desire for Improvement ?Financial Resources/Insurance ?Housing ?Resilience ?Social Support  ?ADL's:  Intact  ?Cognition: WNL  ?Sleep:  Good- reports sleeping well   ? ?Screenings: ?ECT-MADRS   ? ?Flowsheet Row ECT Treatment from 05/05/2015 in [place]  ?MADRS Total Score 33  ? ?  ? ?Mini-Mental   ? ?Flowsheet Row ECT Treatment from 05/05/2015 in [place]  ?Total Score (max 30 points ) 30  ? ?  ? ? ? ?Assessment and Plan:  ?  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 ? ? ?As on prior visits he reports he is currently doing well and enjoying his retirement , spending time watching baseball/ sports , working on his new home, travelling , and interacting with his wife and adult children.  ?Currently he presents euthymic with a full range of affect , and denies feeling depressed or anhedonic .  ? ?He continues to use cannabis regularly , states he has had no untoward effect from cannabis use . Acknowledges cannabis use was causing  tension with wife and daughter but states that this has subsided and they have accepted it is his decision to make . Reports that his relationship with wife is currently stable .  ? ?He is off antidepres

## 2021-10-17 ENCOUNTER — Encounter (HOSPITAL_COMMUNITY): Payer: Self-pay | Admitting: Psychiatry

## 2021-10-17 ENCOUNTER — Telehealth (HOSPITAL_BASED_OUTPATIENT_CLINIC_OR_DEPARTMENT_OTHER): Payer: Medicare Other | Admitting: Psychiatry

## 2021-10-17 DIAGNOSIS — F325 Major depressive disorder, single episode, in full remission: Secondary | ICD-10-CM | POA: Diagnosis not present

## 2021-10-17 NOTE — Progress Notes (Signed)
[facility] MD/PA/NP OP Progress Note  10/17/2021 10:17 AM WALLIS VANCOTT  MRN:  [id number]  Chief Complaint: Medication management appointment HPI:   This was an in office, face to face appointment   Duration 30 minutes   Location of parties Patient - - reports he is currently in [facility]  Provider- clinic office  Dr [name] reports he is doing well .  He reports he is functioning well in daily activities . Reports he is doing " all right " and describes his mood has been stable . Denies feeling depressed and does not endorse pervasive sadness or anhedonia. Denies neuro-vegetative symptoms and describes sleep, appetite, and energy level as within normal .  He states he is enjoying retirement and that his family relationships are stable. States his relationship with his wife , Aggie Cosier , is stable and in general has improved over the last year . Reports she is aware that he uses cannabis which she does not approve of but which she has agreed to tolerate .  His youngest daughter, had been been a source of anxiety/stress for him because she has a history of alcohol use disorder , depression and had difficulty finding /maintaining a job. States that she is currently doing a lot better, is now sober and has found a job in her field .  He is currently in [place] ( grew up there and one of his daughter's lives there ) . He is visiting his daughter and friends . He has also gone to [employer] ( he is an avid baseball fan ) .  He reports he uses cannabis 3-4 x per week , denies having had any untoward effects or problems. Denies alcohol or any other drug use .  As noted, he stopped taking antidepressant  ( Effexor XR ) about 18 months ago .  He states he has had no recurrence of depression or anxiety off antidepressant and that mood remains stable . He presents euthymic, without neuro-vegetative symptoms .                  Visit Diagnosis: MDD by history, Cannabis Use  Disorder Past Psychiatric History:   Past Medical History:  Past Medical History:  Diagnosis Date   Cannabis dependence ([facility]) 03/13/2014   Depression    Hypertension 05/19/14    Past Surgical History:  Procedure Laterality Date   NO PAST SURGERIES      Family Psychiatric History:   Family History:  Family History  Problem Relation Age of Onset   Depression Sister    Schizophrenia Sister    Heart attack Father     Social History:  Social History   Socioeconomic History   Marital status: Married    Spouse name: Not on file   Number of children: Not on file   Years of education: Not on file   Highest education level: Not on file  Occupational History   Not on file  Tobacco Use   Smoking status: Former    Packs/day: 0.50    Types: Cigarettes    Start date: 04/09/2016    Quit date: 04/10/2016    Years since quitting: 5.5   Smokeless tobacco: Former  Building services engineer Use: Never used  Substance and Sexual Activity   Alcohol use: No    Alcohol/week: 0.0 standard drinks of alcohol   Drug use: No    Types: Marijuana    Comment: last used about a month and half ago  Sexual activity: Yes    Partners: Female    Birth control/protection: None  Other Topics Concern   Not on file  Social History Narrative   Not on file   Social Determinants of Health   Financial Resource Strain: Unknown (02/20/2017)   Overall Financial Resource Strain (CARDIA)    Difficulty of Paying Living Expenses: Patient refused  Food Insecurity: Unknown (02/20/2017)   Hunger Vital Sign    Worried About Running Out of Food in the Last Year: Patient refused    [place] in the Last Year: Patient refused  Transportation Needs: Unknown (02/20/2017)   PRAPARE - Hydrologist (Medical): Patient refused    Lack of Transportation (Non-Medical): Patient refused  Physical Activity: Unknown (02/20/2017)   Exercise Vital Sign    Days of Exercise per Week: Patient  refused    Minutes of Exercise per Session: Patient refused  Stress: Unknown (02/20/2017)   [place] of Stress : Patient refused  Social Connections: Unknown (02/20/2017)   Social Connection and Isolation Panel [NHANES]    Frequency of Communication with Friends and Family: Patient refused    Frequency of Social Gatherings with Friends and Family: Patient refused    Attends Religious Services: Patient refused    Marine scientist or Organizations: Patient refused    Attends Music therapist: Patient refused    Marital Status: Patient refused    Allergies: No Known Allergies  Metabolic Disorder Labs: No results found for: "HGBA1C", "MPG" No results found for: "PROLACTIN" No results found for: "CHOL", "TRIG", "HDL", "CHOLHDL", "VLDL", "LDLCALC" No results found for: "TSH"  Therapeutic Level Labs: No results found for: "LITHIUM" No results found for: "VALPROATE" No results found for: "CBMZ"  Current Medications: Current Outpatient Medications  Medication Sig Dispense Refill   benazepril (LOTENSIN) 10 MG tablet Take 10 mg by mouth daily.     rosuvastatin (CRESTOR) 5 MG tablet   1   No current facility-administered medications for this visit.     Musculoskeletal:  Psychiatric Specialty Exam: Please take into account limitations in obtaining a full mental status exam in the context of phone communication Review of Systems none reported  There were no vitals taken for this visit.There is no height or weight on file to calculate BMI.  General Appearance: NA  Eye Contact:  NA  Speech:  Normal Rate  Volume:  Normal  Mood:  Euthymic. Mood stable   Affect:  Appropriate/ reactive - no irritability or expansive affect noted   Thought Process:  Linear and Descriptions of Associations: Intact  Orientation:  Full (Time, Place, and Person)  Thought Content:  No hallucinations, no  delusions     Suicidal Thoughts:  No denies suicidal or self-injurious ideations, future oriented  Homicidal Thoughts:  No no  violent or homicidal ideations  Memory:   Recent and remote grossly intact  Judgement:  Other:  Present  Insight:  Fair/improving  Psychomotor Activity:  NA  Concentration:  Concentration: Good and Attention Span: Good  Recall:  Good  Fund of Knowledge: Good  Language: Good  Akathisia:  Negative  Handed:  Right  AIMS (if indicated):   Assets:  Desire for Improvement Financial Resources/Insurance Housing Resilience Social Support  ADL's:  Intact  Cognition: WNL  Sleep:  Good- reports sleeping well    Screenings: ECT-MADRS    Flowsheet Row [facility]  MADRS Total Score 33      Mini-Mental    Flowsheet Row ECT Treatment [facility]05/2015 in [facility]  Total Score (max 30 points ) 30        Assessment and Plan:    71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   Reports he is currently doing well . Denies depression , denies neuro-vegetative symptoms, and presents euthymic  Functioning well in daily activities -  reports stable family relationships at this time, and is currently in [place] visiting daughter .  He is not currently taking psychiatric medications ( decided to stop antidepressant  ( Effexor XR ) last year). His mood remains stable/euthymic. He reports regular cannabis use, denies alcohol or other drug use . At this time denies any untoward effects related to cannabis use .   We have reviewed potential increased risk of depression recurrence off antidepressant . We have also reviewed negative impact that cannabis may have on his health and how his cannabis use has caused family stressors /relationship difficulties in his family  relationships and have encouraged him to consider abstinence .   He is aware of the mood / neuro-vegetative symptoms that may indicate developing depression and states he keeps a close eye on his mood states and will consider restarting an antidepressant in the event he starts feeling depressed .   He has not wanted to terminate outpatient follow up with writer yet , reports that these appointments are supportive and helpful for him . We have continued to review coping skills /ego strengths and have provided psycho-education as above       Dx  MDD by history, currently in remission Cannabis Use Disorder   No medications prescribed by me at this time He agrees to contact clinic sooner should there be any worsening or concern prior to next appt Will see in 4-6 weeks          Craige Cotta, MD 10/17/2021, 10:17 AM  Patient ID: Lynnette Caffey, male   DOB: [birth year], 71 y.o.   MRN: [id number]

## 2021-11-28 ENCOUNTER — Telehealth (HOSPITAL_COMMUNITY): Payer: Medicare Other | Admitting: Psychiatry

## 2021-12-26 ENCOUNTER — Encounter (HOSPITAL_COMMUNITY): Payer: Self-pay | Admitting: Psychiatry

## 2021-12-26 ENCOUNTER — Ambulatory Visit (HOSPITAL_BASED_OUTPATIENT_CLINIC_OR_DEPARTMENT_OTHER): Payer: Medicare Other | Admitting: Psychiatry

## 2021-12-26 DIAGNOSIS — F325 Major depressive disorder, single episode, in full remission: Secondary | ICD-10-CM | POA: Diagnosis not present

## 2021-12-26 NOTE — Progress Notes (Signed)
BH MD/PA/NP OP Progress Note  12/26/2021 11:16 AM Jose Perez  MRN:  [id number]  Chief Complaint: Medication management appointment HPI:   This was an in office, face to face appointment   Duration 30 minutes    Dr. [name] reports that he has been doing well.  He describes daily activities and family relationships as stable and rewarding at this time.  His relationship with his wife is described as stable, supportive and generally improved compared to previous years.  He also reports that Jill Side, his youngest daughter , has been doing well  ( she has a history of ETOH Use Disorder) , currently sober, employed.  He reports his other children Mining engineer , who is married, lives in [place], and [place] , who is a PA, is married and lives near patient ) are also doing very well .   He reports mood as stable and presents euthymic, with a full range of affect . Does not endorse neuro-vegetative symptoms of depression at this time . 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   He reports that he feels he is settling into retirement ( retired 2-3 years ago, but had been busy travelling, dealing with litigation from his partners , building his new house ) . States that now he feels " like I am truly retired " and notices he has more free time . He states that in this context he has been thinking more about activities he might participate in or do. Has considered volunteering , has started going back to church more regularly , and also has considered writing .   We have reviewed coping skills , ego strengths and facilitated review of his current situation and plans for the future .  He continues to use cannabis regularly, not daily. Denies having had any problems or negative consequences from cannabis use at this time . He states  ( as has been reviewed in previous notes) that cannabis use generated significant marital discord / stress in the past as his wife disapproves of his using marihuana, but that overtime this has become less of an issue and that their relationship is stable , supportive , improved . Denies ETOH abuse or any other substance use . He has a history of tobacco use- quit smoking several years ago.  He is currently not on any psychiatric medications. He takes Benazepril for HTN .     Visit Diagnosis: MDD by history, Cannabis Use Disorder Past Psychiatric History:   Past Medical History:  Past Medical History:  Diagnosis Date   Cannabis dependence ([facility]) 03/13/2014   Depression    Hypertension 05/19/14    Past Surgical History:  Procedure Laterality Date   NO PAST SURGERIES      Family Psychiatric History:   Family History:  Family History  Problem Relation Age of Onset   Depression Sister    Schizophrenia Sister    Heart attack Father     Social History:  Social History   Socioeconomic History   Marital status: Married    Spouse name: Not on file   Number of children: Not on file   Years of education: Not on file   Highest education level: Not on file  Occupational History   Not on file  Tobacco Use   Smoking status: Former    Packs/day:  0.50    Types: Cigarettes    Start date: 04/09/2016    Quit date: 04/10/2016    Years since quitting: 5.7   Smokeless tobacco: Former  Building services engineer Use: Never used  Substance and Sexual Activity   Alcohol use: No    Alcohol/week: 0.0 standard drinks of alcohol   Drug use: No    Types: Marijuana    Comment: last used about a month and half ago   Sexual activity: Yes    Partners: Female    Birth control/protection: None  Other Topics Concern   Not on file  Social History Narrative   Not on file   Social Determinants of Health   Financial Resource Strain: Unknown (02/20/2017)   Overall Financial Resource Strain (CARDIA)     Difficulty of Paying Living Expenses: Patient refused  Food Insecurity: Unknown (02/20/2017)   Hunger Vital Sign    Worried About Running Out of Food in the Last Year: Patient refused    Ran Out of Food in the Last Year: Patient refused  Transportation Needs: Unknown (02/20/2017)   PRAPARE - Transportation    Lack of Transportation (Medical): Patient refused    Lack of Transportation (Non-Medical): Patient refused  Physical Activity: Unknown (02/20/2017)   Exercise Vital Sign    Days of Exercise per Week: Patient refused    Minutes of Exercise per Session: Patient refused  Stress: Unknown (02/20/2017)   [employer] of Occupational Health - Occupational Stress Questionnaire    Feeling of Stress : Patient refused  Social Connections: Unknown (02/20/2017)   Social Connection and Isolation Panel [NHANES]    Frequency of Communication with Friends and Family: Patient refused    Frequency of Social Gatherings with Friends and Family: Patient refused    Attends Religious Services: Patient refused    Database administrator or Organizations: Patient refused    Attends Engineer, structural: Patient refused    Marital Status: Patient refused    Allergies: No Known Allergies  Metabolic Disorder Labs: No results found for: "HGBA1C", "MPG" No results found for: "PROLACTIN" No results found for: "CHOL", "TRIG", "HDL", "CHOLHDL", "VLDL", "LDLCALC" No results found for: "TSH"  Therapeutic Level Labs: No results found for: "LITHIUM" No results found for: "VALPROATE" No results found for: "CBMZ"  Current Medications: Current Outpatient Medications  Medication Sig Dispense Refill   benazepril (LOTENSIN) 10 MG tablet Take 10 mg by mouth daily.     rosuvastatin (CRESTOR) 5 MG tablet   1   No current facility-administered medications for this visit.     Musculoskeletal:  Psychiatric Specialty Exam:  Review of Systems none reported  There were no vitals taken for this  visit.There is no height or weight on file to calculate BMI.  General Appearance: casually groomed, calm, good eye contact, pleasant  Eye Contact:  good   Speech:  Normal Rate  Volume:  Normal  Mood:  Euthymic. Mood stable   Affect:  Appropriate/ reactive   Thought Process:  Linear and Descriptions of Associations: Intact  Orientation:  Full (Time, Place, and Person)  Thought Content:  No hallucinations, no delusions     Suicidal Thoughts:  No denies suicidal or self-injurious ideations, future oriented  Homicidal Thoughts:  No no  violent or homicidal ideations  Memory:   Recent and remote grossly intact  Judgement:  Other:  Present  Insight:  Fair/improving  Psychomotor Activity:  NA  Concentration:  Concentration: Good and Attention Span: Good  Recall:  Good  Fund of Knowledge: Good  Language: Good  Akathisia:  Negative  Handed:  Right  AIMS (if indicated):   Assets:  Desire for Improvement Financial Resources/Insurance Housing Resilience Social Support  ADL's:  Intact  Cognition: WNL  Sleep:  Good- reports sleeping well    Screenings: ECT-MADRS    Flowsheet Row ECT Treatment from 05/05/2015 in [place]  MADRS Total Score 33      Mini-Mental    Flowsheet Row ECT Treatment from 05/05/2015 in [facility]  Total Score (max 30 points ) 30        Assessment and Plan:    71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  Reports he is doing well currently . He reports stable and supportive family life and states his three children are doing well in their lives at this time. He reports that he finds himself less busy now ( retired a couple of years ago but had remained busy , travelling often, building new house ) and feels like " I am truly retired now". In this context has been thinking more about  structured activities and projects to engage in and is considering volunteering , writing .   Overall , presents stable and euthymic at this time , without neuro-vegetative symptoms.   Per his preference he is not currently on psychiatric medications , stopped Effexor ( which he had taken for years for MDD) , about two years ago. He is aware that MDD can be recurrent , and states he maintains vigilance of his mood and sense of well being and would consider resuming an antidepressant if he feels depressed again in the future .   As noted in prior notes , he states these visits are helpful to him, not only to monitor mood/affect , etc, but also to review current life situation and stressors and for general support /review of coping skills . We have continued to review coping skills /ego strengths/ provided psycho-education as above  Next appointment in about 8-10 weeks, he agrees to contact clinic sooner if any worsening prior and also has number for crisis hotline if needs    No medications prescribed at this time      Dx  MDD by history, currently in remission Cannabis Use Disorder           Jenne Campus, MD 12/26/2021, 11:16 AM  Patient ID: Jose Perez, male   DOB: [birth year], 71 y.o.   MRN: [id number]

## 2022-02-06 ENCOUNTER — Ambulatory Visit (HOSPITAL_BASED_OUTPATIENT_CLINIC_OR_DEPARTMENT_OTHER): Payer: Medicare Other | Admitting: Psychiatry

## 2022-02-06 ENCOUNTER — Encounter (HOSPITAL_COMMUNITY): Payer: Self-pay | Admitting: Psychiatry

## 2022-02-06 DIAGNOSIS — F325 Major depressive disorder, single episode, in full remission: Secondary | ICD-10-CM

## 2022-02-06 NOTE — Progress Notes (Signed)
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  He presents euthymic with a full range of affect  and does not endorse anhedonia or neuro-vegetative symptoms. He  reports functioning well in daily activities ( he is retired ). Describes good /stable relationship with his wife and children . Recently travelled to [place] with his wife.  States trip went well .  He is not currently taking any psychiatric medication ( took Effexor for many years, for management of depression ) . Stopped Effexor close to 2 years ago, and has had no recurrence of depression  .   He continues to use cannabis intermittently . Denies excessive drinking or other drug use . Stopped smoking cigarettes several years ago.  He was recently diagnosed with basal cell carcinoma on L side of his face which required surgical removal. States it was larger than was initially suspected and required several sutures . Reports he is recovering well.   He is currently not on any psychiatric medications. He takes Benazepril for HTN .   Current stressors include recent basal cell carcinoma dx and treatment and his daughter ( age 9/lives in [place]) losing her job recently, but states he is confident she will get another job soon.  Using motivational / non confrontational style/techniques ,  have continued to review potential negative consequences of cannabis use , and today he reported he was aware that by smoking cannabis he incur some increased risk of COPD or lung disease .     Visit Diagnosis: MDD by history, Cannabis Use Disorder Past Psychiatric History:   Past Medical History:  Past Medical History:  Diagnosis Date   Cannabis dependence ([place]) 03/13/2014   Depression    Hypertension 05/19/14    Past  Surgical History:  Procedure Laterality Date   NO PAST SURGERIES      Family Psychiatric History:   Family History:  Family History  Problem Relation Age of Onset   Depression Sister    Schizophrenia Sister    Heart attack Father     Social History:  Social History   Socioeconomic History   Marital status: Married    Spouse name: Not on file   Number of children: Not on file   Years of education: Not on file   Highest education level: Not on file  Occupational History   Not on file  Tobacco Use   Smoking status: Former    Packs/day: 0.50    Types: Cigarettes    Start date: 04/09/2016    Quit date: 04/10/2016    Years since quitting: 5.8   Smokeless tobacco: Former  Scientific laboratory technician Use: Never used  Substance and Sexual Activity   Alcohol use: No    Alcohol/week: 0.0 standard drinks of alcohol   Drug use: No    Types: Marijuana    Comment: last used about a month and half ago   Sexual activity: Yes    Partners: Female    Birth control/protection: None  Other Topics Concern   Not on file  Social History Narrative   Not on file   Social Determinants of Health   Financial Resource Strain: Unknown (02/20/2017)   Overall Financial Resource Strain (CARDIA)    Difficulty of Paying Living  Expenses: Patient refused  Food Insecurity: Unknown (02/20/2017)   Hunger Vital Sign    Worried About Running Out of Food in the Last Year: Patient refused    Ran Out of Food in the Last Year: Patient refused  Transportation Needs: Unknown (02/20/2017)   PRAPARE - Administrator, Civil Service (Medical): Patient refused    Lack of Transportation (Non-Medical): Patient refused  Physical Activity: Unknown (02/20/2017)   Exercise Vital Sign    Days of Exercise per Week: Patient refused    Minutes of Exercise per Session: Patient refused  Stress: Unknown (02/20/2017)   [employer] of Occupational Health - Occupational Stress Questionnaire    Feeling of Stress  : Patient refused  Social Connections: Unknown (02/20/2017)   Social Connection and Isolation Panel [NHANES]    Frequency of Communication with Friends and Family: Patient refused    Frequency of Social Gatherings with Friends and Family: Patient refused    Attends Religious Services: Patient refused    Database administrator or Organizations: Patient refused    Attends Engineer, structural: Patient refused    Marital Status: Patient refused    Allergies: No Known Allergies  Metabolic Disorder Labs: No results found for: "HGBA1C", "MPG" No results found for: "PROLACTIN" No results found for: "CHOL", "TRIG", "HDL", "CHOLHDL", "VLDL", "LDLCALC" No results found for: "TSH"  Therapeutic Level Labs: No results found for: "LITHIUM" No results found for: "VALPROATE" No results found for: "CBMZ"  Current Medications: Current Outpatient Medications  Medication Sig Dispense Refill   benazepril (LOTENSIN) 10 MG tablet Take 10 mg by mouth daily.     rosuvastatin (CRESTOR) 5 MG tablet   1   No current facility-administered medications for this visit.     Musculoskeletal:  Psychiatric Specialty Exam:  Review of Systems none reported  There were no vitals taken for this visit.There is no height or weight on file to calculate BMI.  General Appearance: casually groomed, calm, good eye contact, pleasant  Eye Contact:  good   Speech:  Normal Rate  Volume:  Normal  Mood:  Euthymic. Mood stable   Affect:  Appropriate/ reactive   Thought Process:  Linear and Descriptions of Associations: Intact  Orientation:  Full (Time, Place, and Person)  Thought Content:  No hallucinations, no delusions     Suicidal Thoughts:  No denies suicidal or self-injurious ideations, future oriented  Homicidal Thoughts:  No no  violent or homicidal ideations  Memory:   Recent and remote grossly intact  Judgement:  Other:  Present  Insight:  Fair/improving  Psychomotor Activity:  NA  Concentration:   Concentration: Good and Attention Span: Good  Recall:  Good  Fund of Knowledge: Good  Language: Good  Akathisia:  Negative  Handed:  Right  AIMS (if indicated):   Assets:  Desire for Improvement Financial Resources/Insurance Housing Resilience Social Support  ADL's:  Intact  Cognition: WNL  Sleep:  Good- reports sleeping well    Screenings: ECT-MADRS    Flowsheet Row [facility]CT Treatment [facility]2015 [facility]  MADRS Total Score 33      Mini-Mental    Flowsheet Row ECT Treatment from 05/05/2015 in [facility]  Total Score (max 30 points ) 30        Assessment and Plan:    71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 Presents euthymic, with full range of affect . No neuro-vegetative symptoms, reports he is functioning well in daily activities   Per his preference he is not currently on psychiatric medications. We have reviewed -  aware that MDD can be recurrent . Prefers not to restart any antidepressant medication for now, but states he continues to  maintain vigilance of his mood /well being, possible emergence of neuro-vegetative symptoms, and that he would   consider resuming an antidepressant if he feels depressed again in the future .   As noted in prior notes , he states these visits are helpful to him, not only to monitor mood/affect , etc, but also to review current life situation and stressors and for general support /review of coping skills .   Next appointment in about 6-8  weeks, he agrees to contact clinic sooner if any worsening prior and also has number for crisis hotline if needs    No medications prescribed at this time      Dx  MDD by history, currently in remission Cannabis Use Disorder           Craige Cotta, MD 02/06/2022,  12:52 PM  Patient ID: Lynnette Caffey, male   DOB: [birth year], 71 y.o.   MRN: [id number]

## 2022-03-21 ENCOUNTER — Ambulatory Visit (HOSPITAL_BASED_OUTPATIENT_CLINIC_OR_DEPARTMENT_OTHER): Payer: Medicare Other | Admitting: Psychiatry

## 2022-03-21 ENCOUNTER — Encounter (HOSPITAL_COMMUNITY): Payer: Self-pay | Admitting: Psychiatry

## 2022-03-21 DIAGNOSIS — F325 Major depressive disorder, single episode, in full remission: Secondary | ICD-10-CM

## 2022-03-21 NOTE — Progress Notes (Signed)
BH MD/PA/NP OP Progress Note  03/21/2022 10:30 AM ROOSVELT CHURCHWELL  MRN:  [id number]  Chief Complaint: Medication management appointment HPI:   This was a phone based appointment.  Patient's identity verified, limitations associated with this type of communication have been reviewed. 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  Currently he is in [place] for the holidays, visiting his youngest daughter . Planning to return to [place] within the next 1-2 weeks.  Reports he has been doing well . Mood stable/euthymic . No neuro-vegetative symptoms reported .  Future oriented .  Continues to report he is enjoying retirement. Had recently been diagnosed with basal cell carcinoma on face, which was removed . Reports has healed well . Plans to follow periodically with dermatologist .  He presents euthymic with a full range of affect  and does not endorse anhedonia or neuro-vegetative symptoms. He  reports functioning well in daily activities ( he is retired ). Describes good /stable relationship with his wife and children . Hie reports his children are all doing well at present . He had been concerned about his youngest daughter, Jill Side ,  who had recently been unemployed, but fortunately she has found a new, better paying and more personally rewarding job .   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 He has a history of MDD. Took Effexor XR for years. He made decision to stop Effexor 2 years ago. His mood has remained stable , without recurrence of depression . Potential increased of depression recurrence off antidepressant has been reviewed . 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 His preference has been not to restart antidepressant medication for now, but to maintain vigilance for potential emerging depressive symptoms. He presents with good insight regarding symptoms of depression to monitor for , including neurovegetative symptoms and anhedonia . As above , currently he remains euthymic, with full range of affect . He presents future oriented and spoke about possibly starting to write a book .   Today we have focused on termination/transition issues . I have informed him that I will be leaving clinic in March 2024 . Marland Kitchen If he wants, he will continue to be followed by another [facility] clinician for support and monitoring . He expressed understanding        Visit Diagnosis: MDD by history, currently in full remission, Cannabis Use Disorder Past Psychiatric History:   Past Medical History:  Past Medical History:  Diagnosis Date   Cannabis dependence (HCC) 03/13/2014   Depression    Hypertension 05/19/14    Past Surgical History:  Procedure Laterality Date   NO PAST SURGERIES      Family Psychiatric History:   Family History:  Family History  Problem Relation Age of Onset   Depression Sister    Schizophrenia Sister    Heart attack Father     Social History:  Social History   Socioeconomic History   Marital status: Married    Spouse name: Not on file   Number of children: Not on file   Years of education: Not on file   Highest education level: Not on file  Occupational History   Not on  file  Tobacco Use   Smoking status: Former    Packs/day: 0.50    Types: Cigarettes    Start date: 04/09/2016    Quit date: 04/10/2016    Years since quitting: 5.9   Smokeless tobacco: Former  Building services engineer Use: Never used  Substance and Sexual Activity   Alcohol use: No    Alcohol/week: 0.0 standard drinks of alcohol   Drug use: No    Types: Marijuana    Comment: last used about a month and half ago   Sexual activity: Yes    Partners: Female     Birth control/protection: None  Other Topics Concern   Not on file  Social History Narrative   Not on file   Social Determinants of Health   Financial Resource Strain: Unknown (02/20/2017)   Overall Financial Resource Strain (CARDIA)    Difficulty of Paying Living Expenses: Patient refused  Food Insecurity: Unknown (02/20/2017)   Hunger Vital Sign    Worried About Running Out of Food in the Last Year: Patient refused    Ran Out of Food in the Last Year: Patient refused  Transportation Needs: Unknown (02/20/2017)   PRAPARE - Transportation    Lack of Transportation (Medical): Patient refused    Lack of Transportation (Non-Medical): Patient refused  Physical Activity: Unknown (02/20/2017)   Exercise Vital Sign    Days of Exercise per Week: Patient refused    Minutes of Exercise per Session: Patient refused  Stress: Unknown (02/20/2017)   [employer] of Occupational Health - Occupational Stress Questionnaire    Feeling of Stress : Patient refused  Social Connections: Unknown (02/20/2017)   Social Connection and Isolation Panel [NHANES]    Frequency of Communication with Friends and Family: Patient refused    Frequency of Social Gatherings with Friends and Family: Patient refused    Attends Religious Services: Patient refused    Database administrator or Organizations: Patient refused    Attends Engineer, structural: Patient refused    Marital Status: Patient refused    Allergies: No Known Allergies  Metabolic Disorder Labs: No results found for: "HGBA1C", "MPG" No results found for: "PROLACTIN" No results found for: "CHOL", "TRIG", "HDL", "CHOLHDL", "VLDL", "LDLCALC" No results found for: "TSH"  Therapeutic Level Labs: No results found for: "LITHIUM" No results found for: "VALPROATE" No results found for: "CBMZ"  Current Medications: Current Outpatient Medications  Medication Sig Dispense Refill   benazepril (LOTENSIN) 10 MG tablet Take 10 mg by mouth  daily.     rosuvastatin (CRESTOR) 5 MG tablet   1   No current facility-administered medications for this visit.     Musculoskeletal:  Psychiatric Specialty Exam: Please take into account difficulty in obtaining a full mental status exam in the context of phone communication Review of Systems none reported  There were no vitals taken for this visit.There is no height or weight on file to calculate BMI.  General Appearance NA   Eye Contact:  good   Speech:  Normal Rate  Volume:  Normal  Mood:  Euthymic. Reports mood as " pretty good "   Affect:  Appropriate/ reactive   Thought Process:  Linear and Descriptions of Associations: Intact  Orientation:  Full (Time, Place, and Person)  Thought Content:  No hallucinations, no delusions     Suicidal Thoughts:  No No SI, future oriented   Homicidal Thoughts:  No   Memory:   Recent and remote grossly intact  Judgement:  Other:  Present  Insight:  improving  Psychomotor Activity:  NA  Concentration:  Concentration: Good and Attention Span: Good  Recall:  Good  Fund of Knowledge: Good  Language: Good  Akathisia:  Negative  Handed:  Right  AIMS (if indicated):   Assets:  Desire for Improvement Financial Resources/Insurance Housing Resilience Social Support  ADL's:  Intact  Cognition: WNL  Sleep:  Good- reports sleeping well    Screenings: ECT-MADRS    Flowsheet Row ECT [facility]/05/[facility]  MADRS Total Score 33      Mini-Mental    Flowsheet Row ECT Treatment from 05/05/2015 in [facility]  Total Score (max 30 points ) 30        Assessment and Plan:    71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  Past history of suspected cannabis induced mood disorder as well.  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 He is currently in [place] spending time with his daughter . He remains  euthymic, with full  range of affect  without neuro-vegetative symptoms,  functioning well in daily activities   Per his preference he is not currently on psychiatric medication ( stopped Effexor about two years ago) . He is aware that MDD could  be recurrent . He  also reports that enjoyable retirement /decreased stress has contributed to mood stability . Prefers not to take and antidepressant medications and to continue maintaining vigilance of his mood /well being, including remaining vigilant regarding possible emergence of neuro-vegetative symptoms.  This clinician will be leaving clinic in March 2024 . Reviewed with patient . After this date , if he wants, he can  continue to follow with another [facility] clinician .   Next appointment in about 6-8  weeks, he agrees to contact clinic sooner if any worsening prior and also has number for crisis hotline if needs    No medications prescribed at this time      Dx  MDD by history, currently in full remission Cannabis Use Disorder           Craige Cotta, MD 03/21/2022, 10:30 AM  Patient ID: Lynnette Caffey, male   DOB: 1950/12/21, 71 y.o.   MRN: [id number]

## 2022-05-01 ENCOUNTER — Ambulatory Visit (HOSPITAL_BASED_OUTPATIENT_CLINIC_OR_DEPARTMENT_OTHER): Payer: Medicare Other | Admitting: Psychiatry

## 2022-05-01 ENCOUNTER — Encounter (HOSPITAL_COMMUNITY): Payer: Self-pay | Admitting: Psychiatry

## 2022-05-01 DIAGNOSIS — F325 Major depressive disorder, single episode, in full remission: Secondary | ICD-10-CM

## 2022-05-01 NOTE — Progress Notes (Signed)
BH MD/PA/NP OP Progress Note  05/01/2022 11:28 AM Jose Perez  MRN:  [id number]  Chief Complaint: Medication management appointment HPI:   This was a face-to-face appointment/in office  Duration 30 minutes    Jose Perez came to today's appointment with his wife Jose Perez.    He reports that he has been doing well.  Functioning well in daily activities.  Enjoying retirement.  Family relationships described as stable. Mood has remained stable without depression or significant neurovegetative symptoms.  No anhedonia.  [name] corroborates that he has been doing well and that his mood has been stable.  She does report that he continues to use cannabis regularly.  As noted in previous notes, Jose Perez has a history of MDD, with at least two episodes of severe depression in the past ( none recent) 1 of which was treated with ECT.  For years was treated with Effexor XR , which he responded well to.  He decided to stop antidepressant/Effexor XR 2 years ago and has expressed a preference not to restart an antidepressant/expressed preference not to be on any psychiatric medications..  His mood has generally remained stable off Effexor without episodes of significant depression. He points out that because he is now retired, enjoying his life, financially stable,  he is now facing significantly less stress than he did at the time when he had severe depressive episodes , which he feels may have contributed to these episodes at the time.  He has expressed awareness that depression may be recurrent condition and that there is a risk he may have another depressive episode in the future . He is aware of what the  symptoms of emerging depression could  be, including neurovegetative symptoms and states he would promptly seek further treatment if any worsening depression . His wife also reports that she monitors his mood and that if she notices worsening depression she would assist him in seeking treatment .     Today he presents euthymic, with a full range of affect.  He does not endorse any significant neurovegetative symptoms. No SI. Future oriented .    Today we focused on termination/transition issues. I will be leaving clinic in March 2024.   Reviewed options.  As he is not currently on standing psychiatric medications he may not require referral for medication management., but he does state  that he has benefited from meeting with Clinical research associate periodically (to monitor mood, review how he is doing ,   provide support ). We discussed referring for individual psychotherapy and he expressed interest .   He acknowledges he uses cannabis regularly, but not daily. Denies any side effects or issues with cannabis use . Does not endorse any other drug use . We have reviewed potential risks associated with cannabis use.          Visit Diagnosis: MDD by history, currently in full remission, Cannabis Use Disorder Past Psychiatric History:   Past Medical History:  Past Medical History:  Diagnosis Date   Cannabis dependence (HCC) 03/13/2014   Depression    Hypertension 05/19/14    Past Surgical History:  Procedure Laterality Date   NO PAST SURGERIES      Family Psychiatric History:   Family History:  Family History  Problem Relation Age of Onset   Depression Sister    Schizophrenia Sister    Heart attack Father     Social History:  Social History   Socioeconomic History   Marital status: Married    Spouse  name: Not on file   Number of children: Not on file   Years of education: Not on file   Highest education level: Not on file  Occupational History   Not on file  Tobacco Use   Smoking status: Former    Packs/day: 0.50    Types: Cigarettes    Start date: 04/09/2016    Quit date: 04/10/2016    Years since quitting: 6.0   Smokeless tobacco: Former  Scientific laboratory technician Use: Never used  Substance and Sexual Activity   Alcohol use: No    Alcohol/week: 0.0 standard drinks of  alcohol   Drug use: No    Types: Marijuana    Comment: last used about a month and half ago   Sexual activity: Yes    Partners: Female    Birth control/protection: None  Other Topics Concern   Not on file  Social History Narrative   Not on file   Social Determinants of Health   Financial Resource Strain: Unknown (02/20/2017)   Overall Financial Resource Strain (CARDIA)    Difficulty of Paying Living Expenses: Patient refused  Food Insecurity: Unknown (02/20/2017)   Hunger Vital Sign    Worried About [place] in the Last Year: Patient refused    [place] in the Last Year: Patient refused  Transportation Needs: Unknown (02/20/2017)   [place] - Transportation    Lack of Transportation (Medical): Patient refused    Lack of Transportation (Non-Medical): Patient refused  Physical Activity: Unknown (02/20/2017)   Exercise Vital Sign    Days of Exercise per Week: Patient refused    Minutes of Exercise per Session: Patient refused  Stress: Unknown (02/20/2017)   [place] of Stress : Patient refused  Social Connections: Unknown (02/20/2017)   Social Connection and Isolation Panel [NHANES]    Frequency of Communication with Friends and Family: Patient refused    Frequency of Social Gatherings with Friends and Family: Patient refused    Attends Religious Services: Patient refused    Marine scientist or Organizations: Patient refused    Attends Music therapist: Patient refused    Marital Status: Patient refused    Allergies: No Known Allergies  Metabolic Disorder Labs: No results found for: "HGBA1C", "MPG" No results found for: "PROLACTIN" No results found for: "CHOL", "TRIG", "HDL", "CHOLHDL", "VLDL", "LDLCALC" No results found for: "TSH"  Therapeutic Level Labs: No results found for: "LITHIUM" No results found for: "VALPROATE" No results found for:  "CBMZ"  Current Medications: Current Outpatient Medications  Medication Sig Dispense Refill   benazepril (LOTENSIN) 10 MG tablet Take 10 mg by mouth daily.     rosuvastatin (CRESTOR) 5 MG tablet   1   No current facility-administered medications for this visit.     Musculoskeletal:  Psychiatric Specialty Exam:  Review of Systems none reported  There were no vitals taken for this visit.There is no height or weight on file to calculate BMI.  General Appearance well groomed, calm, without restlessness or agitation , makes good eye contact ,  interactive.  Pleasant on approach. Speech fluent and normal in Tone , Volume, Rate   Eye Contact:  good   Speech:  Normal Rate  Volume:  Normal  Mood:  Euthymic. Reports mood as " pretty good "   Affect:  Appropriate/ reactive   Thought Process:  Linear and Descriptions of Associations: Intact  Orientation:  Full (Time, Place, and Person)  Thought Content:  No hallucinations, no delusions     Suicidal Thoughts:  No No SI, future oriented   Homicidal Thoughts:  No   Memory:   Recent and remote grossly intact  Judgement:  Other:  Present  Insight:  improving  Psychomotor Activity:  NA  Concentration:  Concentration: Good and Attention Span: Good  Recall:  Good  Fund of Knowledge: Good  Language: Good  Akathisia:  Negative  Handed:  Right  AIMS (if indicated):   Assets:  Desire for Improvement Financial Resources/Insurance Housing Resilience Social Support  ADL's:  Intact  Cognition: WNL  Sleep:  Good- reports sleeping well    Screenings: ECT-MADRS    Flowsheet Row ECT Treatment from 05/05/2015 in [place]  MADRS Total Score 33      Mini-Mental    Flowsheet Row ECT Treatment from 05/05/2015 in [place]  Total Score (max 30 points ) 30        Assessment and Plan:    72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    Currently Jose Perez reports he continues to do well. He presents  euthymic, with full range of affect  without neuro-vegetative symptoms,  functioning well in daily activities . States he is enjoying retirement .   Per his preference he is not currently on psychiatric medication ( stopped Effexor about two years ago) . He is aware that MDD could  be recurrent . He  reports that enjoyable retirement /decreased stress has contributed to mood stability . Prefers not to take and antidepressant medications and to continue maintaining vigilance of his mood /well being, including remaining vigilant regarding possible emergence of neuro-vegetative symptoms.  This clinician will be leaving clinic in March 2024 . We have reviewed  options. As above, he is not currently on standing psychiatric medication . He does report he benefits from regular appointments for monitoring / support . Expresses interest in referral for individual / supportive psychotherapy .  Acknowledges regular cannabis use, denies ETOH or other drug abuse . Denies having any side effects or untoward events related to cannabis .  Next appointment in about 6-8 weeks, he agrees to contact clinic sooner if any worsening prior and also has number for crisis hotline if needs    No medications prescribed at this time   Referral for individual psychotherapy      Dx  MDD by history, currently in full remission Cannabis Use Disorder           Jenne Campus, MD 05/01/2022, 11:28 AM  Patient ID: Jose Perez, male   DOB: 06-01-1950, 72 y.o.   MRN: [id number]

## 2022-06-13 ENCOUNTER — Ambulatory Visit (HOSPITAL_BASED_OUTPATIENT_CLINIC_OR_DEPARTMENT_OTHER): Payer: Medicare Other | Admitting: Psychiatry

## 2022-06-13 ENCOUNTER — Encounter (HOSPITAL_COMMUNITY): Payer: Self-pay | Admitting: Psychiatry

## 2022-06-13 DIAGNOSIS — F325 Major depressive disorder, single episode, in full remission: Secondary | ICD-10-CM | POA: Diagnosis not present

## 2022-06-13 NOTE — Progress Notes (Signed)
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    He reports that he has continued to do well . He describes functioning well in daily activities and is enjoying retirement.  Describes family/marital relationships are stable .  He denies depression and describes mood as stable . His  mood presents  euthymic, with a full range of affect .  Denies significant  neuro-vegetative symptoms- no anhedonia . He  has multiple interests, including reading , baseball , and is involved with his children.  No SI and remains future oriented .  His wife corroborates that his mood has been stable.  Hx  of using cannabis intermittently/regularly. Has denied having untoward effects related to cannabis. No other substance use reported .   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 He has had at least two ( remote )  episodes of severe depression , and in or around 2005 he required inpatient psychiatric admission + ECT . (*He had an episode of increased irritability /anger/impulsivity a few years ago, which he attributed to significant/ increased family tension at the time, but also reported the possibility of having used cannabis at the time that could have been laced with another substance. Did not endorse past history of hypomania/mania )  .     He  decided to stop antidepressant medication ( Effexor XR ) about two years ago, and is currently not taking psychiatric medication . He  has not had a recurrence of depression and mood has generally remained stable .  He explains that because he is now , in the context of retirement/ financial security, he is now  facing significantly less stress/ stressors than he did in the past, which he feels contributed to depression in the past  . He states that  in the context of retirement/decreased stress/ support he has become better able to manage frustrations  and " not let it get to me " .   We have reviewed the potential risk for a recurrence of depression based on history as above . He has expressed understanding and reports that both him and his wife maintain vigilance for potential emerging  depressive symptoms in which case he would consider restarting pharmacotherapy.    He has benefited from ongoing outpatient management to assess mood, provide support , and review coping skills and ego strengths .   Currently his preference is not to restart antidepressant medication management , but as above, he reports him and his wife ( who is a Therapist, sports) closely monitor his mood .    His youngest daughter, Jose Perez, who lives in [place], recently lost her job, which he is worried about . He plans to continue providing her with support and financial assistance and to encourage her to apply for another position .   Potential adverse /negative consequences of cannabis use have been reviewed .   Termination issues reviewed . Writer is leaving clinic in March and he will follow with another outpatient [facility] clinic . Writer has been following [name] for many  years. He is interested in referral for individual psychotherapy and also, although not currently taking psychiatric medication, expresses interest in periodic follow ups with psychiatrist for monitoring/assessment /support       Visit Diagnosis: MDD by history, currently  in full remission, Cannabis Use Disorder Past Psychiatric History:   Past Medical History:  Past Medical History:  Diagnosis Date   Cannabis dependence ([place]) 03/13/2014   Depression    Hypertension 05/19/14    Past Surgical History:  Procedure Laterality Date   NO PAST SURGERIES      Family Psychiatric History:   Family History:  Family History  Problem Relation Age of Onset   Depression Sister    Schizophrenia Sister     Heart attack Father     Social History:  Social History   Socioeconomic History   Marital status: Married    Spouse name: Not on file   Number of children: Not on file   Years of education: Not on file   Highest education level: Not on file  Occupational History   Not on file  Tobacco Use   Smoking status: Former    Packs/day: 0.50    Types: Cigarettes    Start date: 04/09/2016    Quit date: 04/10/2016    Years since quitting: 6.1   Smokeless tobacco: Former  Scientific laboratory technician Use: Never used  Substance and Sexual Activity   Alcohol use: No    Alcohol/week: 0.0 standard drinks of alcohol   Drug use: No    Types: Marijuana    Comment: last used about a month and half ago   Sexual activity: Yes    Partners: Female    Birth control/protection: None  Other Topics Concern   Not on file  Social History Narrative   Not on file   Social Determinants of Health   Financial Resource Strain: Unknown (02/20/2017)   Overall Financial Resource Strain (CARDIA)    Difficulty of Paying Living Expenses: Patient refused  Food Insecurity: Unknown (02/20/2017)   Hunger Vital Sign    Worried About [employer] in the Last Year: Patient refused    [place] in the Last Year: Patient refused  Transportation Needs: Unknown (02/20/2017)   [place] - Transportation    Lack of Transportation (Medical): Patient refused    Lack of Transportation (Non-Medical): Patient refused  Physical Activity: Unknown (02/20/2017)   Exercise Vital Sign    Days of Exercise per Week: Patient refused    Minutes of Exercise per Session: Patient refused  Stress: Unknown (02/20/2017)   [place] of Stress : Patient refused  Social Connections: Unknown (02/20/2017)   Social Connection and Isolation Panel [NHANES]    Frequency of Communication with Friends and Family: Patient refused    Frequency of Social Gatherings  with Friends and Family: Patient refused    Attends Religious Services: Patient refused    Marine scientist or Organizations: Patient refused    Attends Music therapist: Patient refused    Marital Status: Patient refused    Allergies: No Known Allergies  Metabolic Disorder Labs: No results found for: "HGBA1C", "MPG" No results found for: "PROLACTIN" No results found for: "CHOL", "TRIG", "HDL", "CHOLHDL", "VLDL", "LDLCALC" No results found for: "TSH"  Therapeutic Level Labs: No results found for: "LITHIUM" No results found for: "VALPROATE" No results found for: "CBMZ"  Current Medications: Current Outpatient Medications  Medication Sig Dispense Refill   benazepril (LOTENSIN) 10 MG tablet Take 10 mg by mouth daily.     rosuvastatin (CRESTOR) 5 MG tablet   1   No current facility-administered medications for this visit.  Musculoskeletal:  Psychiatric Specialty Exam:  Review of Systems none reported  There were no vitals taken for this visit.There is no height or weight on file to calculate BMI.  General Appearance alert, attentive, calm, well groomed, good eye contact ,  interactive.   Eye Contact:  good   Speech:  Normal Rate  Volume:  Normal  Mood:  Euthymic. Reports mood as stable    Affect:  Appropriate/ reactive   Thought Process:  Linear and Descriptions of Associations: Intact  Orientation:  Full (Time, Place, and Person)  Thought Content:  No hallucinations, no delusions     Suicidal Thoughts:  No No SI, future oriented   Homicidal Thoughts:  No   Memory:   Recent and remote grossly intact  Judgement:  Other:  Present  Insight:  improving  Psychomotor Activity:  NA  Concentration:  Concentration: Good and Attention Span: Good  Recall:  Good  Fund of Knowledge: Good  Language: Good  Akathisia:  Negative  Handed:  Right  AIMS (if indicated):   Assets:  Desire for Improvement Financial Resources/Insurance Housing Resilience Social  Support  ADL's:  Intact  Cognition: WNL  Sleep:  Good- reports sleeping well    Screenings: ECT-MADRS    Flowsheet Row ECT Treatment from 05/05/2015 in [place]  MADRS Total Score 33      Mini-Mental    Flowsheet Row ECT Treatment from 05/05/2015 in [place]  Total Score (max 30 points ) 30        Assessment and Plan:    72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    Currently endorses stable mood / no neuro-vegetative symptoms and presents euthymic . Reports he is doing well in daily activities and enjoying retirement . Marital/ family relationships described as stable .   As above, he has been off antidepressant medication x 2 years without recurrence of significant/severe depression. He reports that  risk of recurrence likely lower at this time as he is now retired, Tourist information centre manager, enjoying daily activities and generally under significant less stress than when he was working . He is aware of MDD potentially being a recurrent disease. His preference is not to restart antidepressant medication but plans to ( along with his wife ) maintain vigilance . He is well aware of symptoms of depression to watch out for / neuro-vegetative symptoms , and states he would promptly inform his clinician/ seek treatment if he feels depressed again .   He has a history of regular cannabis use. In the past cannabis use has caused family tension, particularly with his wife and youngest daughter, but states that this has been significantly less of an issue over the last 1-2 years. Denies any negative effect from cannabis at this time. Potential deleterious effects of cannabis have been reviewed .   He has benefited from ongoing support and review of adaptive coping skills , ego strengths and expresses interest in ongoing treatment (individual therapy/ psychiatric follow up  )  Termination issues have been reviewed /processed .   Next appointment in about 4-6 weeks with [facility] clinician. Will also refer for individual psychotherapy .   He  agrees to contact clinic sooner if any worsening prior and also has number for crisis hotline if needs    No medications prescribed at this time       Dx  MDD by history, currently in full  remission Cannabis Use Disorder           Jenne Campus, MD 06/13/2022, 11:54 AM  Patient ID: Jose Perez, male   DOB: 24-Jul-1950, 72 y.o.   MRN: [id number]

## 2022-06-27 ENCOUNTER — Ambulatory Visit (HOSPITAL_COMMUNITY): Payer: Medicare Other | Admitting: Licensed Clinical Social Worker

## 2022-07-11 ENCOUNTER — Encounter (HOSPITAL_COMMUNITY): Payer: Self-pay

## 2022-07-11 ENCOUNTER — Encounter (HOSPITAL_COMMUNITY): Payer: Self-pay | Admitting: Licensed Clinical Social Worker

## 2022-07-11 ENCOUNTER — Ambulatory Visit (INDEPENDENT_AMBULATORY_CARE_PROVIDER_SITE_OTHER): Payer: Medicare Other | Admitting: Licensed Clinical Social Worker

## 2022-07-11 DIAGNOSIS — F325 Major depressive disorder, single episode, in full remission: Secondary | ICD-10-CM

## 2022-07-11 NOTE — Progress Notes (Signed)
Virtual Visit via Video Note  I connected with Jose Perez on 07/11/22 at  9:00 AM EDT by a video enabled telemedicine application and verified that I am speaking with the correct person using two identifiers.  Location: Patient: home Provider: office   I discussed the limitations of evaluation and management by telemedicine and the availability of in person appointments. The patient expressed understanding and agreed to proceed.   I discussed the assessment and treatment plan with the patient. The patient was provided an opportunity to ask questions and all were answered. The patient agreed with the plan and demonstrated an understanding of the instructions.   The patient was advised to call back or seek an in-person evaluation if the symptoms worsen or if the condition fails to improve as anticipated.  I provided 50 minutes of non-face-to-face time during this encounter.   Mindi Curling, LCSW   Comprehensive Clinical Assessment (CCA) Note  07/11/2022 Jose Perez [id number]  Chief Complaint: Hx of depression. Would like to have a therapist to follow up with in case sxs return.   Visit Diagnosis: MDD, in remission   CCA Biopsychosocial Intake/Chief Complaint:  72 years of depression. Not depressed, off medications, takes cannabis for treatment of depression. Was a physician for career, 6 hospitalizations over life span, longest hospitalization in [place] 10-12 treatments of ECT. Off meds for about 3 years. Had taken Effexor many many years. ECT a second time for about 3 sessions.  Current Symptoms/Problems: not depressed. Retired 3 years ago, renovated a house, visits son in [place], daughter in [place], daughter in town.   Patient Reported Schizophrenia/Schizoaffective Diagnosis in Past: No   Strengths: physically healthy, not having depressive sxs now. Loves to travel, has been Museum/gallery curator. highly intelligent, healthy marriage.  Preferences: to have someone to  touch base with in case he needs something.  Abilities: knows himself well and can see when he is getting depressed.   Type of Services Patient Feels are Needed: OPT and psychiatry   Initial Clinical Notes/Concerns: stabilized depression at this time   Mental Health Symptoms Depression:   None (72 years of chronic depression)   Duration of Depressive symptoms: No data recorded  Mania:   None   Anxiety:    None   Psychosis:   None   Duration of Psychotic symptoms: No data recorded  Trauma:   None   Obsessions:   None   Compulsions:   None   Inattention:   None   Hyperactivity/Impulsivity:   None   Oppositional/Defiant Behaviors:   None   Emotional Irregularity:   None   Other Mood/Personality Symptoms:  No data recorded   Mental Status Exam Appearance and self-care  Stature:   Average   Weight:   Average weight   Clothing:   Age-appropriate   Grooming:   Well-groomed   Cosmetic use:   None   Posture/gait:   Normal   Motor activity:   Not Remarkable   Sensorium  Attention:   Normal   Concentration:   Normal   Orientation:   X5   Recall/memory:   Normal   Affect and Mood  Affect:   Full Range   Mood:   Euthymic   Relating  Eye contact:   Normal   Facial expression:   Responsive   Attitude toward examiner:   Cooperative   Thought and Language  Speech flow:  Normal   Thought content:   Appropriate to Mood and Circumstances   Preoccupation:  None   Hallucinations:   None   Organization:  No data recorded  [employer] of Knowledge:   Good   Intelligence:   Above Average   Abstraction:   Normal   Judgement:   Good   Reality Testing:   Realistic   Insight:   Good   Decision Making:   Normal   Social Functioning  Social Maturity:   Responsible   Social Judgement:   Normal   Stress  Stressors:   Family conflict (daughter experiencing depression)   Coping Ability:    Resilient; Normal   Skill Deficits:   None   Supports:   Family     Religion: Religion/Spirituality Are You A Religious Person?: Yes What is Your Religious Affiliation?: Catholic How Might This Affect Treatment?: learning about new religions now  Leisure/Recreation: Leisure / Recreation Do You Have Hobbies?: Yes Leisure and Hobbies: writing a book, doing research  Exercise/Diet: Exercise/Diet Do You Exercise?: Yes What Type of Exercise Do You Do?: Other (Comment) (plays golf, rowing) How Many Times a Week Do You Exercise?: Daily Have You Gained or Lost A Significant Amount of Weight in the Past Six Months?: No Do You Follow a Special Diet?: No Do You Have Any Trouble Sleeping?: No   CCA Employment/Education Employment/Work Situation: Employment / Work Nurse, children's Situation: Retired Social research officer, 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 Has Patient ever Been in the [employer]?: No  Education: Education Is Patient Currently Attending School?: No Did Teacher, adult education From [employer]?: Yes Did Physicist, medical?: Yes What Type of College Degree Do you Have?: philosophy Did You Attend Graduate School?: Yes What is Your Post Graduate Degree?: MD What Was Your Major?: Neonatology Did You Have An Individualized Education Program (IIEP): No Did You Have Any Difficulty At School?: No Patient's Education Has Been Impacted by Current Illness: No   CCA Family/Childhood History Family and Relationship History: Family history Marital status: Married Number of Years Married: 59 What types of issues is patient dealing with in the relationship?: had a separation for a while, but getting along well now Are you sexually active?: No What is your sexual orientation?: heterosexual Has your sexual activity been affected by drugs, alcohol,  medication, or emotional stress?: no Does patient have children?: Yes How many children?: 3 How is patient's relationship with their children?: good relationship with children  Childhood History:  Childhood History By whom was/is the patient raised?: Both parents Additional childhood history information: raised strict Catholic Description of patient's relationship with caregiver when they were a child: close relationship with parents Patient's description of current relationship with people who raised him/her: dad died at 27 years old, heart problems. Mom died at 30- colon cancer Does patient have siblings?: Yes Number of Siblings: 3 Description of patient's current relationship with siblings: oldest- nun died at 75, sister- schizophrenia died at 88, Younger brother still living- alcoholic, Training and development officer, estranged Did patient suffer any verbal/emotional/physical/sexual abuse as a child?: No Did patient suffer from severe childhood neglect?: No Has patient ever been sexually abused/assaulted/raped as an adolescent or adult?: No Was the patient ever a victim of a crime or a disaster?: No Witnessed domestic violence?: No Has patient been affected by domestic violence as an adult?: No (wife's sister was murdered in 1985 by her husband)  Child/Adolescent Assessment:     CCA Substance Use Alcohol/Drug  Use: Alcohol / Drug Use Longest period of sobriety (when/how long): uses marijuana daily Withdrawal Symptoms: None                         ASAM's:  Six Dimensions of Multidimensional Assessment  Dimension 1:  Acute Intoxication and/or Withdrawal Potential:      Dimension 2:  Biomedical Conditions and Complications:      Dimension 3:  Emotional, Behavioral, or Cognitive Conditions and Complications:     Dimension 4:  Readiness to Change:     Dimension 5:  Relapse, Continued use, or Continued Problem Potential:     Dimension 6:  Recovery/Living Environment:     ASAM Severity  Score:    ASAM Recommended Level of Treatment:     Substance use Disorder (SUD)    Recommendations for Services/Supports/Treatments: Recommendations for Services/Supports/Treatments Recommendations For Services/Supports/Treatments: Individual Therapy  DSM5 Diagnoses: Patient Active Problem List   Diagnosis Date Noted   Severe recurrent major depression without psychotic features    Major depression 03/13/2014   Cannabis dependence 03/13/2014    Patient Centered Plan: Patient is on the following Treatment Plan(s):  Depression   Referrals to Alternative Service(s): Referred to Alternative Service(s):   Place:   Date:   Time:    Referred to Alternative Service(s):   Place:   Date:   Time:    Referred to Alternative Service(s):   Place:   Date:   Time:    Referred to Alternative Service(s):   Place:   Date:   Time:      Collaboration of Care: Psychiatrist AEB reviewed recent records from Dr. Parke Poisson. [name] will see Dr. Adele Schilder in 2 weeks  Patient/Guardian was advised Release of Information must be obtained prior to any record release in order to collaborate their care with an outside provider. Patient/Guardian was advised if they have not already done so to contact the registration department to sign all necessary forms in order for [place] to release information regarding their care.   Consent: Patient/Guardian gives verbal consent for treatment and assignment of benefits for services provided during this visit. Patient/Guardian expressed understanding and agreed to proceed.   Mindi Curling, LCSW

## 2022-07-27 ENCOUNTER — Encounter (HOSPITAL_COMMUNITY): Payer: Self-pay | Admitting: Psychiatry

## 2022-07-27 ENCOUNTER — Ambulatory Visit (HOSPITAL_BASED_OUTPATIENT_CLINIC_OR_DEPARTMENT_OTHER): Payer: Medicare Other | Admitting: Psychiatry

## 2022-07-27 VITALS — BP 133/86 | HR 96 | Resp 18 | Ht 66.0 in | Wt 177.6 lb

## 2022-07-27 DIAGNOSIS — F325 Major depressive disorder, single episode, in full remission: Secondary | ICD-10-CM

## 2022-07-27 NOTE — Progress Notes (Signed)
Psychiatric Initial Adult Assessment   Patient Identification: WESTIN KNOTTS MRN:  [id number] Date of Evaluation:  07/27/2022  Referral Source: Dr. Jama Flavors Chief Complaint:   Chief Complaint  Patient presents with   Establish Care   Visit Diagnosis:    ICD-10-CM   1. Major depressive disorder in remission, unspecified whether recurrent  F32.5       History of Present Illness: Patient is 72 year old Caucasian, retired, married man who is referred from Dr. Jama Flavors who is no longer in the practice.  Patient started seeing Dr. Jama Flavors since 2006.  He was seen here in [facility] and after Dr. Jama Flavors moved to [facility] patient continued to see him recently.  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  He has multiple hospitalizations due to severe depression.  He also had a history of anxiety, cannabis dependence.  Patient told he had tried numerous antidepressant however finally he feels cannabis has helped him a lot.  His last antidepressant was Effexor 225 mg which he used 2-1/2 years ago.  He reported symptoms free from the depression.  He denies any feeling of hopelessness, worthlessness.  He denies any crying spells, anhedonia, insomnia, irritability, anger, mood swing, agitation.  His appetite is okay.  His weight is stable.  Patient is retired Recruitment consultant.  He lives with his wife who was also seen by Dr. Jama Flavors in the past when she was struggling with drinking.  Patient has a son who lives in [place], a daughter lives in [place] and another daughter lives in [place].  Currently he is not taking any medication other than cannabis use.  He has done PHP, IOP, ECT and had multiple inpatient claimed to be stable for a while.  He is active in his life.  He does travel, read, write and had a good relationship with his family member.  Denies any substance use.  Denies any mania or psychosis.  He likes to follow up as needed basis in case he need to go back on  medication.  So far he is symptom free.  Past Psychiatric History: Long history of depression, social anxiety and cannabis dependence.  History of inpatient and received ECT.  Has been on multiple medication and last medicine was Effexor.  He was seeing Dr. Jama Flavors for more than 18 years.  No history of suicidal attempt, self-mutilation, psychosis, mania.  Previous Psychotropic Medications: Yes   Substance Abuse History in the last 12 months:  No.  Consequences of Substance Abuse: NA  Past Medical History:  Past Medical History:  Diagnosis Date   Cannabis dependence 03/13/2014   Depression    Hypertension 05/19/14    Past Surgical History:  Procedure Laterality Date   NO PAST SURGERIES      Family Psychiatric History: Reviewed.  Family History:  Family History  Problem Relation Age of Onset   Depression Sister    Schizophrenia Sister    Heart attack Father     Social History:   Social History   Socioeconomic History   Marital status: Married    Spouse name: Not on file   Number of children: Not on file   Years of education: Not on file   Highest education level: Not on file  Occupational History   Not on file  Tobacco Use   Smoking status: Former    Packs/day: .5    Types: Cigarettes    Start date: 04/09/2016    Quit date: 04/10/2016    Years since  quitting: 6.2   Smokeless tobacco: Former  Building services engineer Use: Never used  Substance and Sexual Activity   Alcohol use: No    Alcohol/week: 0.0 standard drinks of alcohol   Drug use: No    Types: Marijuana    Comment: last used about a month and half ago   Sexual activity: Yes    Partners: Female    Birth control/protection: None  Other Topics Concern   Not on file  Social History Narrative   Not on file   Social Determinants of Health   Financial Resource Strain: Unknown (02/20/2017)   Overall Financial Resource Strain (CARDIA)    Difficulty of Paying Living Expenses: Patient declined  Food  Insecurity: Unknown (02/20/2017)   Hunger Vital Sign    Worried About Running Out of Food in the Last Year: Patient declined    Ran Out of Food in the Last Year: Patient declined  Transportation Needs: Unknown (02/20/2017)   PRAPARE - Administrator, Civil Service (Medical): Patient declined    Lack of Transportation (Non-Medical): Patient declined  Physical Activity: Unknown (02/20/2017)   Exercise Vital Sign    Days of Exercise per Week: Patient declined    Minutes of Exercise per Session: Patient declined  Stress: Unknown (02/20/2017)   [employer] of Occupational Health - Occupational Stress Questionnaire    Feeling of Stress : Patient declined  Social Connections: Unknown (02/20/2017)   Social Connection and Isolation Panel [NHANES]    Frequency of Communication with Friends and Family: Patient declined    Frequency of Social Gatherings with Friends and Family: Patient declined    Attends Religious Services: Patient declined    Database administrator or Organizations: Patient declined    Attends Banker Meetings: Patient declined    Marital Status: Patient declined    Additional Social History: Patient is a retired live with his wife.  He has 3 children.  He is active in his life.  Allergies:  No Known Allergies  Metabolic Disorder Labs: No results found for: "HGBA1C", "MPG" No results found for: "PROLACTIN" No results found for: "CHOL", "TRIG", "HDL", "CHOLHDL", "VLDL", "LDLCALC" No results found for: "TSH"  Therapeutic Level Labs: No results found for: "LITHIUM" No results found for: "CBMZ" No results found for: "VALPROATE"  Current Medications: Current Outpatient Medications  Medication Sig Dispense Refill   benazepril (LOTENSIN) 10 MG tablet Take 10 mg by mouth daily.     rosuvastatin (CRESTOR) 5 MG tablet   1   No current facility-administered medications for this visit.    Musculoskeletal: Strength & Muscle Tone: within normal  limits Gait & Station: normal Patient leans: N/A  Psychiatric Specialty Exam: Review of Systems  Blood pressure 133/86, pulse 96, resp. rate 18, height 5\' 6"  (1.676 m), weight 177 lb 9.6 oz (80.6 kg).Body mass index is 28.67 kg/m.  General Appearance: Casual  Eye Contact:  Good  Speech:  Clear and Coherent  Volume:  Normal  Mood:  Euthymic  Affect:  Appropriate  Thought Process:  Goal Directed  Orientation:  Full (Time, Place, and Person)  Thought Content:  Logical  Suicidal Thoughts:  No  Homicidal Thoughts:  No  Memory:  Immediate;   Good Recent;   Good Remote;   Good  Judgement:  Good  Insight:  Good  Psychomotor Activity:  Normal  Concentration:  Concentration: Good and Attention Span: Good  Recall:  Good  Fund of Knowledge:Good  Language: Good  Akathisia:  No  Handed:  Right  AIMS (if indicated):  not done  Assets:  Communication Skills Desire for Improvement Financial Resources/Insurance Housing Physical Health Resilience Social Support Talents/Skills Transportation  ADL's:  Intact  Cognition: WNL  Sleep:  Good   Screenings: ECT-MADRS    Flowsheet Row ECT Treatment from 05/05/2015 in [facility]  MADRS Total Score 33      Mini-Mental    Flowsheet Row ECT Treatment from 05/05/2015 in [facility]  Total Score (max 30 points ) 30       Assessment and Plan: 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  He is seeing Dr. Jama Flavors who recently left the practice.  I reviewed notes from previous provider.,  Psychosocial, past history, medical history.  Patient like to keep the appointment in the future in case his symptoms started to get worse and need medication.  He has been off from antidepressant for past 2 years.  He does not have any symptoms of depression or anxiety.  He does use regular cannabis use which he believes helps his depression but he  understand it is not legal in this state.  He does not like to go back on antidepressant because he had tried numerous medication and that did not help as much.  He also had a history of ECT which he do not believe helpful either.  I discussed with the patient about use of marijuana.  So far he is stable and did not have any depressive or anxiety symptoms.  Patient like to follow-up in 3 months however he is aware if symptoms started to get worse then he will call us sooner.  Collaboration of Care: Other provider involved in patient's care AEB notes are available in epic to review.  Patient/Guardian was advised Release of Information must be obtained prior to any record release in order to collaborate their care with an outside provider. Patient/Guardian was advised if they have not already done so to contact the registration department to sign all necessary forms in order for [place] to release information regarding their care.   Consent: Patient/Guardian gives verbal consent for treatment and assignment of benefits for services provided during this visit. Patient/Guardian expressed understanding and agreed to proceed.   Cleotis Nipper, MD 4/18/20244:51 PM

## 2022-08-15 ENCOUNTER — Encounter (HOSPITAL_COMMUNITY): Payer: Self-pay | Admitting: Licensed Clinical Social Worker

## 2022-08-15 ENCOUNTER — Ambulatory Visit (INDEPENDENT_AMBULATORY_CARE_PROVIDER_SITE_OTHER): Payer: Medicare Other | Admitting: Licensed Clinical Social Worker

## 2022-08-15 DIAGNOSIS — F325 Major depressive disorder, single episode, in full remission: Secondary | ICD-10-CM | POA: Diagnosis not present

## 2022-08-15 NOTE — Progress Notes (Signed)
   THERAPIST PROGRESS NOTE  Session Time: 10:00am-11:00am  Participation Level: Active  Behavioral Response: Well GroomedAlertEuthymic  Type of Therapy: Individual Therapy  Treatment Goals addressed: LTG: Reduce frequency, intensity, and duration of depression symptoms so that daily functioning is improved   ProgressTowards Goals: Initial  Interventions: CBT  Summary: SEARCY OOTEN is a 72 y.o. male who presents with MDD, in remission.   Suicidal/Homicidal: Nowithout intent/plan  Therapist Response: [name] engaged well in individual in person session with clinician. Clinician built rapport with [name], discussed current issues in life, status of depression, and hx of mental health treatment. Clinician provided time and space for [name] to share current activities, interactions, and concerns about daughter. Clinician utilized CBT psychoeducation to identify what happens when trauma is triggered by others. Clinician noted that daughter's experience with depression has been very triggering for [name], who started experiencing significant depression at the same age. Clinician processed [name]'s experience with being in a locked unit in [place], as well as his experience with ECT. [name] shared increased interest in philosophy and religion and reports he has been doing a lot of reading and learning about the one-ness of the universe. Clinician explored the usefulness of this learning in his own search for meaning in his life.   Plan: Return again in 6-8 weeks.  Diagnosis: Major depressive disorder in remission, unspecified whether recurrent (HCC)  Collaboration of Care: Medication Management AEB reviewed note from Dr. Lolly Mustache  Patient/Guardian was advised Release of Information must be obtained prior to any record release in order to collaborate their care with an outside provider. Patient/Guardian was advised if they have not already done so to contact the registration department to sign all necessary forms in  order for [place] to release information regarding their care.   Consent: Patient/Guardian gives verbal consent for treatment and assignment of benefits for services provided during this visit. Patient/Guardian expressed understanding and agreed to proceed.   Chryl Heck [place], LCSW 08/15/2022

## 2022-10-03 ENCOUNTER — Ambulatory Visit (HOSPITAL_COMMUNITY): Payer: Medicare Other | Admitting: Licensed Clinical Social Worker

## 2022-10-26 ENCOUNTER — Telehealth (HOSPITAL_BASED_OUTPATIENT_CLINIC_OR_DEPARTMENT_OTHER): Payer: Medicare Other | Admitting: Psychiatry

## 2022-10-26 ENCOUNTER — Encounter (HOSPITAL_COMMUNITY): Payer: Self-pay | Admitting: Psychiatry

## 2022-10-26 VITALS — Wt 167.0 lb

## 2022-10-26 DIAGNOSIS — F325 Major depressive disorder, single episode, in full remission: Secondary | ICD-10-CM

## 2022-10-26 NOTE — Progress Notes (Signed)
[facility] MD Virtual Progress Note   Patient Location: Home Provider Location: Office  I connect with patient by video and verified that I am speaking with correct person by using two identifiers. I discussed the limitations of evaluation and management by telemedicine and the availability of in person appointments. I also discussed with the patient that there may be a patient responsible charge related to this service. The patient expressed understanding and agreed to proceed.  Jose Perez [id number] 72 y.o.  10/26/2022 3:11 PM  History of Present Illness:  Patient is evaluated by video session.  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  He was referred from Dr. Jama Flavors who left the practice.  He reported his depression is a stable but concerned and anxious about his 73 year old daughter who lives in [place] and not doing very well.  Patient told she suffers from depression and patient is not happy with the current treatment her psychiatrist is giving to the daughter.  Patient stayed in [place] for a month and just came back yesterday.  He feels her daughter's illnesses bothering him because he had suffered the same diagnosis and symptoms many years ago.  Patient told he did have a conversation with her daughter's psychiatrist but now family had decided to bring her daughter back to [place] and to see her previous psychiatrist used to see her when she was in the school.  Patient otherwise doing very well.  He lost 8 to 9 pounds since the last visit.  He does walk every day.  He does use CBT once or twice a week which he has been doing for a while.  He denies any crying spells or any feeling of hopelessness or worthlessness.  He sleeps good.  He does travel, read, write and enjoy his life.  He is a good relationship with a family member.  He is currently not on any psychotropic medication.  Past Psychiatric  History: H/O depression, social anxiety and cannabis dependence.  History of inpatient and received ECT.  On multiple medication and last medicine was Effexor.  Saw Dr. Jama Flavors for more than 18 years.  No history of suicidal attempt, self-mutilation, psychosis, mania.   Outpatient Encounter Medications as of 10/26/2022  Medication Sig   benazepril (LOTENSIN) 10 MG tablet Take 10 mg by mouth daily.   rosuvastatin (CRESTOR) 5 MG tablet    No facility-administered encounter medications on file as of 10/26/2022.    No results found for this or any previous visit (from the past 2160 hour(s)).   Psychiatric Specialty Exam: Physical Exam  Review of Systems  Weight 167 lb (75.8 kg).There is no height or weight on file to calculate BMI.  General Appearance: Casual  Eye Contact:  Good  Speech:  Normal Rate  Volume:  Normal  Mood:  Anxious  Affect:  Congruent  Thought Process:  Goal Directed  Orientation:  Full (Time, Place, and Person)  Thought Content:  Rumination  Suicidal Thoughts:  No  Homicidal Thoughts:  No  Memory:  Immediate;   Good Recent;   Good Remote;   Good  Judgement:  Good  Insight:  Present  Psychomotor Activity:  Normal  Concentration:  Concentration: Good and Attention Span: Good  Recall:  Good  Fund of Knowledge:  Good  Language:  Good  Akathisia:  No  Handed:  Right  AIMS (if indicated):     Assets:  Communication Skills Desire for  Improvement Housing Social Support Transportation  ADL's:  Intact  Cognition:  WNL  Sleep:  ok     Assessment/Plan: Major depressive disorder in remission, unspecified whether recurrent (HCC)  I discussed family stress as patient concerned about his 73 year old daughter who is not doing very well and suffer from depression.  Patient told the plan is to bring her back to [place] to see her previous psychiatrist.  Patient otherwise doing well and is stable.  He is without antidepressant for more than 2 years.  He does not exhibit  any current symptoms of depression that requires on medication.  However patient aware and acknowledge if needed then he will go back on medication.  Patient agreed to have a follow-up in 2 months as he like to give update about his daughter's clinical condition.  I agree with the follow-up and recommend to call us back if he has any question or if he feel the need to move the appointment sooner than 2 months.   Follow Up Instructions:     I discussed the assessment and treatment plan with the patient. The patient was provided an opportunity to ask questions and all were answered. The patient agreed with the plan and demonstrated an understanding of the instructions.   The patient was advised to call back or seek an in-person evaluation if the symptoms worsen or if the condition fails to improve as anticipated.    Collaboration of Care: Other provider involved in patient's care AEB notes are available in epic to review.  Patient/Guardian was advised Release of Information must be obtained prior to any record release in order to collaborate their care with an outside provider. Patient/Guardian was advised if they have not already done so to contact the registration department to sign all necessary forms in order for [place] to release information regarding their care.   Consent: Patient/Guardian gives verbal consent for treatment and assignment of benefits for services provided during this visit. Patient/Guardian expressed understanding and agreed to proceed.     I provided 20 minutes of non face to face time during this encounter.  Note: This document was prepared by [employer] voice dictation technology and any errors that results from this process are unintentional.    Cleotis Nipper, MD 10/26/2022

## 2022-12-06 ENCOUNTER — Ambulatory Visit (HOSPITAL_COMMUNITY): Payer: Medicare Other | Admitting: Licensed Clinical Social Worker

## 2022-12-06 ENCOUNTER — Encounter (HOSPITAL_COMMUNITY): Payer: Self-pay | Admitting: Licensed Clinical Social Worker

## 2022-12-06 DIAGNOSIS — F325 Major depressive disorder, single episode, in full remission: Secondary | ICD-10-CM

## 2022-12-06 NOTE — Progress Notes (Signed)
Virtual Visit via Video Note  I connected with Jose Perez on 12/06/22 at  8:00 AM EDT by a video enabled telemedicine application and verified that I am speaking with the correct person using two identifiers.  Location: Patient: at a [facility] in [place] Provider:home office   I discussed the limitations of evaluation and management by telemedicine and the availability of in person appointments. The patient expressed understanding and agreed to proceed.   I discussed the assessment and treatment plan with the patient. The patient was provided an opportunity to ask questions and all were answered. The patient agreed with the plan and demonstrated an understanding of the instructions.   The patient was advised to call back or seek an in-person evaluation if the symptoms worsen or if the condition fails to improve as anticipated.  I provided 45 minutes of non-face-to-face time during this encounter.   Jose Melter, LCSW   THERAPIST PROGRESS NOTE  Session Time: 8:00am-8:45am  Participation Level: Active  Behavioral Response: Well GroomedAlertEuthymic  Type of Therapy: Individual Therapy  Treatment Goals addressed:  LTG: Reduce frequency, intensity, and duration of depression symptoms so that daily functioning is improved   ProgressTowards Goals: Progressing  Interventions: Motivational Interviewing  Summary: Jose Perez is a 72 y.o. male who presents with MDD, in remission.   Suicidal/Homicidal: Nowithout intent/plan  Therapist Response: Jose Perez engaged well in individual virtual session with clinician. Clinician utilized MI OARS to reflect and summarize thoughts and feelings, process experiences over the summer, and explore changes over the past few months. Jose Perez shared that he has not been depressed. Clinician processed ongoing concerns about daughter's mental health. Clinician provided feedback about options for higher levels of care, counseling, and answered  some questions about alternative treatments. Clinician discussed the impact of daughter's mental health on his own mental health. Clinician reflected the importance of controlling what he can control and allowing others to control themselves. Clinician identified the importance of letting go of past guilt, shame, and being able to stay present in the moment.   Plan: Return again in 4 weeks.  Diagnosis: Major depressive disorder in remission, unspecified whether recurrent (HCC)  Collaboration of Care: Psychiatrist AEB provided update to Dr. Lolly Mustache  Patient/Guardian was advised Release of Information must be obtained prior to any record release in order to collaborate their care with an outside provider. Patient/Guardian was advised if they have not already done so to contact the registration department to sign all necessary forms in order for [place] to release information regarding their care.   Consent: Patient/Guardian gives verbal consent for treatment and assignment of benefits for services provided during this visit. Patient/Guardian expressed understanding and agreed to proceed.   Chryl Heck [place], LCSW 12/06/2022

## 2022-12-27 ENCOUNTER — Telehealth (HOSPITAL_BASED_OUTPATIENT_CLINIC_OR_DEPARTMENT_OTHER): Payer: Medicare Other | Admitting: Psychiatry

## 2022-12-27 ENCOUNTER — Encounter (HOSPITAL_COMMUNITY): Payer: Self-pay | Admitting: Psychiatry

## 2022-12-27 VITALS — Wt 165.0 lb

## 2022-12-27 DIAGNOSIS — F325 Major depressive disorder, single episode, in full remission: Secondary | ICD-10-CM | POA: Diagnosis not present

## 2022-12-27 NOTE — Progress Notes (Signed)
[facility] MD Virtual Progress Note   Patient Location: Home Provider Location: Home Office  I connect with patient by video and verified that I am speaking with correct person by using two identifiers. I discussed the limitations of evaluation and management by telemedicine and the availability of in person appointments. I also discussed with the patient that there may be a patient responsible charge related to this service. The patient expressed understanding and agreed to proceed.  Jose Perez [id number] 72 y.o.  12/27/2022 3:21 PM  History of Present Illness:  Patient is evaluated by video session.  He is more relaxed and calm and reported things are much better since the last visit.  His daughter is now doing very well and treated by psychiatrist here in [place] and went back to [place].  Patient is very busy traveling and he has tried to keep himself busy by reading and exploring the spirituality.  He had a good summer and had a family reunion.  He is trying to lose weight and he had lost 6-7 pounds since the last visit.  He also had a visit with therapist and that has been going well.  Denies any crying spells or any feeling of hopelessness or worthlessness.  He denies any mania, psychosis or any hallucination.  He had a good social network.  He smoked marijuana but frequency has less intense and recent months.  He sleeps good.  He is currently not on any psychotropic medication and does not feel he needed like to follow-up in 4 months for a checkup.  No new medication added from his primary care.  Past Psychiatric History: H/O depression, social anxiety and cannabis dependence.  History of inpatient and received ECT.  On multiple medication and last medicine was Effexor.  Saw Dr. Jama Flavors for more than 18 years.  No history of suicidal attempt, self-mutilation, psychosis, mania.    Outpatient Encounter Medications as of 12/27/2022  Medication Sig   benazepril  (LOTENSIN) 10 MG tablet Take 10 mg by mouth daily.   rosuvastatin (CRESTOR) 5 MG tablet    No facility-administered encounter medications on file as of 12/27/2022.    No results found for this or any previous visit (from the past 2160 hour(s)).   Psychiatric Specialty Exam: Physical Exam  Review of Systems  Weight 165 lb (74.8 kg).There is no height or weight on file to calculate BMI.  General Appearance: Casual  Eye Contact:  Good  Speech:  Clear and Coherent and Normal Rate  Volume:  Normal  Mood:  Euthymic  Affect:  Appropriate  Thought Process:  Goal Directed  Orientation:  Full (Time, Place, and Person)  Thought Content:  WDL  Suicidal Thoughts:  No  Homicidal Thoughts:  No  Memory:  Immediate;   Good Recent;   Good Remote;   Good  Judgement:  Good  Insight:  Good  Psychomotor Activity:  Normal  Concentration:  Concentration: Good and Attention Span: Good  Recall:  Good  Fund of Knowledge:  Good  Language:  Good  Akathisia:  No  Handed:  Right  AIMS (if indicated):     Assets:  Communication Skills Desire for Improvement Housing Resilience Social Support Talents/Skills Transportation  ADL's:  Intact  Cognition:  WNL  Sleep:  ok     Assessment/Plan: Major depressive disorder in remission, unspecified whether recurrent (HCC)  Patient is doing much better since family stresses much better and his daughter back to [place] and doing very well after  she had a severe episode of depression.  Patient currently not taking medication but aware that if needed he will contact us and like to follow-up in 4 months.  No new medication given.  Encouraged to keep appointment with therapist which is helping his coping skills.  Patient also tried to keep himself very busy and enjoying traveling, reading books and exploring spirituality.  We will follow-up in 4 months.   Follow Up Instructions:     I discussed the assessment and treatment plan with the patient. The patient  was provided an opportunity to ask questions and all were answered. The patient agreed with the plan and demonstrated an understanding of the instructions.   The patient was advised to call back or seek an in-person evaluation if the symptoms worsen or if the condition fails to improve as anticipated.    Collaboration of Care: Other provider involved in patient's care AEB notes are available in epic to review  Patient/Guardian was advised Release of Information must be obtained prior to any record release in order to collaborate their care with an outside provider. Patient/Guardian was advised if they have not already done so to contact the registration department to sign all necessary forms in order for [place] to release information regarding their care.   Consent: Patient/Guardian gives verbal consent for treatment and assignment of benefits for services provided during this visit. Patient/Guardian expressed understanding and agreed to proceed.     I provided 12 minutes of non face to face time during this encounter.  Note: This document was prepared by [employer] voice dictation technology and any errors that results from this process are unintentional.    Cleotis Nipper, MD 12/27/2022

## 2023-01-03 ENCOUNTER — Ambulatory Visit (INDEPENDENT_AMBULATORY_CARE_PROVIDER_SITE_OTHER): Payer: Medicare Other | Admitting: Licensed Clinical Social Worker

## 2023-01-03 DIAGNOSIS — F325 Major depressive disorder, single episode, in full remission: Secondary | ICD-10-CM | POA: Diagnosis not present

## 2023-01-07 ENCOUNTER — Encounter (HOSPITAL_COMMUNITY): Payer: Self-pay | Admitting: Licensed Clinical Social Worker

## 2023-01-07 NOTE — Progress Notes (Signed)
Virtual Visit via Video Note  I connected with Jose Perez on 01/03/23 at 11:00 AM EDT by a video enabled telemedicine application and verified that I am speaking with the correct person using two identifiers.  Location: Patient: home Provider: home office   I discussed the limitations of evaluation and management by telemedicine and the availability of in person appointments. The patient expressed understanding and agreed to proceed.   I discussed the assessment and treatment plan with the patient. The patient was provided an opportunity to ask questions and all were answered. The patient agreed with the plan and demonstrated an understanding of the instructions.   The patient was advised to call back or seek an in-person evaluation if the symptoms worsen or if the condition fails to improve as anticipated.  I provided 55 minutes of non-face-to-face time during this encounter.   Jose Melter, LCSW   THERAPIST PROGRESS NOTE  Session Time: 11:00am-11:55am  Participation Level: Active  Behavioral Response: Well GroomedAlertEuthymic  Type of Therapy: Individual Therapy  Treatment Goals addressed: LTG: Reduce frequency, intensity, and duration of depression symptoms so that daily functioning is improved   ProgressTowards Goals: Progressing  Interventions: CBT  Summary: Jose Perez is a 72 y.o. male who presents with MDD, in remission.   Suicidal/Homicidal: Nowithout intent/plan  Therapist Response: Jose Perez engaged well in individual virtual session with clinician. Clinician utilized CBT to process thoughts, feelings, and interactions. Clinician explored communication tactics with daughter, who struggles with depression. Clinician discussed Jose Perez's experience with getting better, and his ability to see his situation from the outside, not just from how he feels. Clinician reflected the tendency for depression to cause a more internalized experience, which does not account  for reality outside the body. Clinician explored current activities that are keeping Jose Perez interested and busy. Jose Perez shared he continues to study philosophy and religion, which has kept him quite busy.   Plan: Return again in 4-6 weeks.  Diagnosis: Major depressive disorder in remission, unspecified whether recurrent (HCC)  Collaboration of Care: Patient refused AEB none required  Patient/Guardian was advised Release of Information must be obtained prior to any record release in order to collaborate their care with an outside provider. Patient/Guardian was advised if they have not already done so to contact the registration department to sign all necessary forms in order for [place] to release information regarding their care.   Consent: Patient/Guardian gives verbal consent for treatment and assignment of benefits for services provided during this visit. Patient/Guardian expressed understanding and agreed to proceed.   Jose Perez [place], LCSW 01/07/2023

## 2023-02-15 ENCOUNTER — Ambulatory Visit (HOSPITAL_COMMUNITY): Payer: Medicare Other | Admitting: Licensed Clinical Social Worker

## 2023-02-15 DIAGNOSIS — F325 Major depressive disorder, single episode, in full remission: Secondary | ICD-10-CM | POA: Diagnosis not present

## 2023-02-20 ENCOUNTER — Encounter (HOSPITAL_COMMUNITY): Payer: Self-pay | Admitting: Licensed Clinical Social Worker

## 2023-02-20 NOTE — Progress Notes (Signed)
Virtual Visit via Video Note  I connected with Jose Perez on 02/15/23 at 10:00 AM EST by a video enabled telemedicine application and verified that I am speaking with the correct person using two identifiers.  Location: Patient: son's home Provider: home office   I discussed the limitations of evaluation and management by telemedicine and the availability of in person appointments. The patient expressed understanding and agreed to proceed.   I discussed the assessment and treatment plan with the patient. The patient was provided an opportunity to ask questions and all were answered. The patient agreed with the plan and demonstrated an understanding of the instructions.   The patient was advised to call back or seek an in-person evaluation if the symptoms worsen or if the condition fails to improve as anticipated.  I provided 45 minutes of non-face-to-face time during this encounter.   Veneda Melter, LCSW   THERAPIST PROGRESS NOTE  Session Time: 10:00am-10:45am  Participation Level: Active  Behavioral Response: Well GroomedAlertEuthymic  Type of Therapy: Individual Therapy  Treatment Goals addressed: LTG: Reduce frequency, intensity, and duration of depression symptoms so that daily functioning is improved   ProgressTowards Goals: Progressing  Interventions: Motivational Interviewing  Summary: Jose Perez is a 72 y.o. male who presents with MDD, in remission.   Suicidal/Homicidal: Nowithout intent/plan  Therapist Response: Jose Perez engaged well in individual virtual session with clinician. Clinician utilized MI OARS to reflect and summarize thoughts and feelings. Clinician explored travels and learning, which have been abundant for Jose Perez this year. Clinician processed emotional status and noted that he has been stable. Clinician explored updates in the family, particularly with daughter in [place] who struggles with depression and alcoholism. Jose Perez shared he has decided to  let her handle her own healthcare, as she has gotten frustrated with him when he tries to help. Clinician reflected this back and noted that this was healthy and a good sign that daughter is ready to take control of her life.  Jose Perez shared what he has been learning, noting a great deal of emphasis on mindfulness, being present in the here and now, and releasing the past. He shared this has been very helpful in allowing himself to feel good and relaxed.  Plan: Return again in 4 weeks.  Diagnosis: Major depressive disorder in remission, unspecified whether recurrent (HCC)  Collaboration of Care: Patient refused AEB none required  Patient/Guardian was advised Release of Information must be obtained prior to any record release in order to collaborate their care with an outside provider. Patient/Guardian was advised if they have not already done so to contact the registration department to sign all necessary forms in order for [place] to release information regarding their care.   Consent: Patient/Guardian gives verbal consent for treatment and assignment of benefits for services provided during this visit. Patient/Guardian expressed understanding and agreed to proceed.   Chryl Heck [place], LCSW 02/20/2023

## 2023-03-22 ENCOUNTER — Encounter (HOSPITAL_COMMUNITY): Payer: Self-pay | Admitting: Licensed Clinical Social Worker

## 2023-03-22 ENCOUNTER — Ambulatory Visit (HOSPITAL_COMMUNITY): Payer: Medicare Other | Admitting: Licensed Clinical Social Worker

## 2023-03-22 DIAGNOSIS — F325 Major depressive disorder, single episode, in full remission: Secondary | ICD-10-CM | POA: Diagnosis not present

## 2023-03-22 NOTE — Progress Notes (Signed)
Virtual Visit via Video Note  I connected with Jose Perez on 03/22/23 at 10:00 AM EST by a video enabled telemedicine application and verified that I am speaking with the correct person using two identifiers.  Location: Patient: parked car Provider: home   I discussed the limitations of evaluation and management by telemedicine and the availability of in person appointments. The patient expressed understanding and agreed to proceed.  I discussed the assessment and treatment plan with the patient. The patient was provided an opportunity to ask questions and all were answered. The patient agreed with the plan and demonstrated an understanding of the instructions.   The patient was advised to call back or seek an in-person evaluation if the symptoms worsen or if the condition fails to improve as anticipated.  I provided 55 minutes of non-face-to-face time during this encounter.   Veneda Melter, LCSW   THERAPIST PROGRESS NOTE  Session Time: 10:00am-10:55am  Participation Level: Active  Behavioral Response: NeatAlertEuthymic  Type of Therapy: Individual Therapy  Treatment Goals addressed: LTG: Reduce frequency, intensity, and duration of depression symptoms so that daily functioning is improved   ProgressTowards Goals: Progressing  Interventions: Motivational Interviewing  Summary: Jose Perez is a 72 y.o. male who presents with MDD, in remission.   Suicidal/Homicidal: Nowithout intent/plan  Therapist Response: Jose Perez engaged well in individual virtual session with clinician. Clinician utilized MI OARS to reflect and summarize thoughts, feelings, and interactions. Clinician provided time and space to update about travels, studies, learning, and relationship with children and wife. Clinician reflected the enjoyment of these activities, noting that this is keeping his mind sharp and his ego at bay. Clinician processed the use of mindfulness, which is a big part of Jose Perez's  research. Clinician provided some feedback about how to communicate with his daughter in a more mindful way, by using active and mindful listening, providing positive and supportive feedback, and not "parenting" by problem-solving or instructing. Jose Perez shared that his mood has been stable and positive. He reports no depressive sxs.   Plan: Return again in 4-6 weeks.  Diagnosis: Major depressive disorder in remission, unspecified whether recurrent (HCC)  Collaboration of Care: Patient refused AEB none required  Patient/Guardian was advised Release of Information must be obtained prior to any record release in order to collaborate their care with an outside provider. Patient/Guardian was advised if they have not already done so to contact the registration department to sign all necessary forms in order for [place] to release information regarding their care.   Consent: Patient/Guardian gives verbal consent for treatment and assignment of benefits for services provided during this visit. Patient/Guardian expressed understanding and agreed to proceed.   Jose Heck [place], LCSW 03/22/2023

## 2023-04-18 ENCOUNTER — Telehealth (HOSPITAL_COMMUNITY): Payer: Self-pay | Admitting: Licensed Clinical Social Worker

## 2023-04-18 NOTE — Telephone Encounter (Signed)
 Patient called and asked to get a call before his appointment on 04/26/2023. Patient did  not state what it was in regards to. Patient stated it is not an urgent manner.

## 2023-04-23 ENCOUNTER — Telehealth (HOSPITAL_BASED_OUTPATIENT_CLINIC_OR_DEPARTMENT_OTHER): Payer: Medicare Other | Admitting: Psychiatry

## 2023-04-23 ENCOUNTER — Encounter (HOSPITAL_COMMUNITY): Payer: Self-pay | Admitting: Psychiatry

## 2023-04-23 VITALS — Wt 162.0 lb

## 2023-04-23 DIAGNOSIS — F325 Major depressive disorder, single episode, in full remission: Secondary | ICD-10-CM | POA: Diagnosis not present
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
## 2023-04-26 ENCOUNTER — Encounter (HOSPITAL_COMMUNITY): Payer: Self-pay | Admitting: Licensed Clinical Social Worker

## 2023-04-26 ENCOUNTER — Ambulatory Visit (HOSPITAL_COMMUNITY): Payer: Medicare Other | Admitting: Licensed Clinical Social Worker

## 2023-04-26 DIAGNOSIS — F325 Major depressive disorder, single episode, in full remission: Secondary | ICD-10-CM

## 2023-04-26 NOTE — Progress Notes (Signed)
Virtual Visit via Video Note  I connected with Jose Perez on 04/26/23 at 10:00 AM EST by a video enabled telemedicine application and verified that I am speaking with the correct person using two identifiers.  Location: Patient: home Provider: home office   I discussed the limitations of evaluation and management by telemedicine and the availability of in person appointments. The patient expressed understanding and agreed to proceed.   I discussed the assessment and treatment plan with the patient. The patient was provided an opportunity to ask questions and all were answered. The patient agreed with the plan and demonstrated an understanding of the instructions.   The patient was advised to call back or seek an in-person evaluation if the symptoms worsen or if the condition fails to improve as anticipated.  I provided 40 minutes of non-face-to-face time during this encounter.   Jose Melter, LCSW   THERAPIST PROGRESS NOTE  Session Time: 10:00am-10:40am  Participation Level: Active  Behavioral Response: NeatAlertEuthymic  Type of Therapy: Individual Therapy  Treatment Goals addressed: LTG: Reduce frequency, intensity, and duration of depression symptoms so that daily functioning is improved   ProgressTowards Goals: Progressing  Interventions: Motivational Interviewing  Summary: Jose Perez is a 73 y.o. male who presents with MDD, in remission.   Suicidal/Homicidal: Nowithout intent/plan  Therapist Response: Jose Perez engaged well in an Armed forces logistics/support/administrative officer with Facilities manager.  Clinician discussed interactions, mood, and concerns over the past several weeks.  Jose Perez shared concerns about daughter who is struggling with alcoholism.  Clinician provided resources and referral information for services in [place].  Clinician processed Jose Perez's thoughts and feelings about his daughter's depression and alcoholism.  Jose Perez shared he has been participating in [employer], which has been somewhat helpful in reframing his own experience with depression and understanding the disease of alcoholism.  Clinician utilized motivational interviewing to reflect and summarize Jose Perez's experience as a parent and spouse of alcoholics.  Plan: Return again in 1 weeks.  Diagnosis: Major depressive disorder in full remission, unspecified whether recurrent (HCC)  Collaboration of Care: Patient refused AEB none required  Patient/Guardian was advised Release of Information must be obtained prior to any record release in order to collaborate their care with an outside provider. Patient/Guardian was advised if they have not already done so to contact the registration department to sign all necessary forms in order for [place] to release information regarding their care.   Consent: Patient/Guardian gives verbal consent for treatment and assignment of benefits for services provided during this visit. Patient/Guardian expressed understanding and agreed to proceed.   Jose Perez [place], LCSW 04/26/2023

## 2023-04-27 ENCOUNTER — Telehealth (HOSPITAL_COMMUNITY): Payer: Self-pay | Admitting: Licensed Clinical Social Worker

## 2023-04-27 NOTE — Telephone Encounter (Signed)
The therapist returns Eriks' call leaving another, HIPAA-compliant voicemail.  Myrna Blazer, MA, LCSW, [facility], LCAS 04/27/2023

## 2023-05-03 ENCOUNTER — Encounter (HOSPITAL_COMMUNITY): Payer: Self-pay | Admitting: Licensed Clinical Social Worker

## 2023-05-03 ENCOUNTER — Ambulatory Visit (HOSPITAL_COMMUNITY): Payer: Medicare Other | Admitting: Licensed Clinical Social Worker

## 2023-05-03 DIAGNOSIS — F325 Major depressive disorder, single episode, in full remission: Secondary | ICD-10-CM

## 2023-05-03 NOTE — Progress Notes (Signed)
Virtual Visit via Video Note  I connected with Jose Perez on 05/03/23 at 11:00 AM EST by a video enabled telemedicine application and verified that I am speaking with the correct person using two identifiers.  Location: Patient: home Provider: home office   I discussed the limitations of evaluation and management by telemedicine and the availability of in person appointments. The patient expressed understanding and agreed to proceed.   I discussed the assessment and treatment plan with the patient. The patient was provided an opportunity to ask questions and all were answered. The patient agreed with the plan and demonstrated an understanding of the instructions.   The patient was advised to call back or seek an in-person evaluation if the symptoms worsen or if the condition fails to improve as anticipated.  I provided 55 minutes of non-face-to-face time during this encounter.   Veneda Melter, LCSW   THERAPIST PROGRESS NOTE  Session Time: 11:00am-11:55am  Participation Level: Active  Behavioral Response: NeatAlertEuthymic  Type of Therapy: Individual Therapy  Treatment Goals addressed:  LTG: Reduce frequency, intensity, and duration of depression symptoms so that daily functioning is improved   ProgressTowards Goals: Progressing  Interventions: Motivational Interviewing  Summary: Jose Perez is a 73 y.o. male who presents with MDD, in remission.   Suicidal/Homicidal: Nowithout intent/plan  Therapist Response: Jose Perez engaged well in individual virtual session with clinician.  Clinician utilized motivational interviewing to reflect and summarized thoughts and feelings about recent occurrences in his life.  Clinician discussed daughter's depression and alcoholism, noting that she has been in contact with different treatment centers to manage her sobriety.  Clinician discussed the role of Al-Anon in Jose Perez's experience at this time.  Clinician identified that Jose Perez's  attitude towards [employer] is different this time with his daughter then it felt when his wife began her recovery many years ago.  Jose Perez shared that his own personal journey with mental health, spirituality, and mindfulness has given him a different perspective on his role as the parent of an alcoholic.  Clinician explored Jose Perez's experience with depression and encouraged him to allow those memories to come forward and to be shared.  Plan: Return again in 3 weeks.  Diagnosis: Major depressive disorder in full remission, unspecified whether recurrent (HCC)  Collaboration of Care: Patient refused AEB none required  Patient/Guardian was advised Release of Information must be obtained prior to any record release in order to collaborate their care with an outside provider. Patient/Guardian was advised if they have not already done so to contact the registration department to sign all necessary forms in order for [place] to release information regarding their care.   Consent: Patient/Guardian gives verbal consent for treatment and assignment of benefits for services provided during this visit. Patient/Guardian expressed understanding and agreed to proceed.   Chryl Heck [place], LCSW 05/03/2023

## 2023-05-31 ENCOUNTER — Ambulatory Visit (HOSPITAL_COMMUNITY): Payer: Medicare Other | Admitting: Licensed Clinical Social Worker

## 2023-07-03 ENCOUNTER — Ambulatory Visit (HOSPITAL_COMMUNITY): Payer: Medicare Other | Admitting: Licensed Clinical Social Worker

## 2023-07-03 DIAGNOSIS — F325 Major depressive disorder, single episode, in full remission: Secondary | ICD-10-CM | POA: Diagnosis not present

## 2023-07-04 ENCOUNTER — Encounter (HOSPITAL_COMMUNITY): Payer: Self-pay | Admitting: Licensed Clinical Social Worker

## 2023-07-04 NOTE — Progress Notes (Signed)
 Virtual Visit via Video Note  I connected with Jose Perez on 07/03/23 at  1:30 PM EDT by a video enabled telemedicine application and verified that I am speaking with the correct person using two identifiers.  Location: Patient: home Provider: office   I discussed the limitations of evaluation and management by telemedicine and the availability of in person appointments. The patient expressed understanding and agreed to proceed.   I discussed the assessment and treatment plan with the patient. The patient was provided an opportunity to ask questions and all were answered. The patient agreed with the plan and demonstrated an understanding of the instructions.   The patient was advised to call back or seek an in-person evaluation if the symptoms worsen or if the condition fails to improve as anticipated.  I provided 45 minutes of non-face-to-face time during this encounter.   Veneda Melter, LCSW   THERAPIST PROGRESS NOTE  Session Time: 1:30pm-2:15pm  Participation Level: Active  Behavioral Response: Well GroomedAlertEuthymic  Type of Therapy: Individual Therapy  Treatment Goals addressed:  LTG: Reduce frequency, intensity, and duration of depression symptoms so that daily functioning is improved   ProgressTowards Goals: Progressing  Interventions: CBT  Summary: Jose Perez is a 73 y.o. male who presents with MDD, in full remission.   Suicidal/Homicidal: Nowithout intent/plan  Therapist Response: [name] engaged well in individual virtual session with clinician.  Clinician utilized CBT to process thoughts feelings and interactions.  Clinician provided time and space for [name] to share activities, interactions, and studies.  Clinician reflected the enjoyment and excitement [name] is feeling due to different projects, exploration of his own spirituality, and travels.  Clinician explored occurrence of depressive moods or thoughts.  [name] shared he continues to use coping skills  as needed, but he has understanding of his role and coping with other people's concerns and problems.  He shared that this has taken a lot of stress and pressure off of him.  [name] shared excitement about upcoming birth of grandchild in [place] and plans to visit often.  Plan: Return again in 4 weeks.  Diagnosis: Major depressive disorder in full remission, unspecified whether recurrent (HCC)  Collaboration of Care: Patient refused AEB none required  Patient/Guardian was advised Release of Information must be obtained prior to any record release in order to collaborate their care with an outside provider. Patient/Guardian was advised if they have not already done so to contact the registration department to sign all necessary forms in order for [place] to release information regarding their care.   Consent: Patient/Guardian gives verbal consent for treatment and assignment of benefits for services provided during this visit. Patient/Guardian expressed understanding and agreed to proceed.   Chryl Heck [place], LCSW 07/04/2023

## 2023-09-12 ENCOUNTER — Ambulatory Visit (HOSPITAL_COMMUNITY): Admitting: Licensed Clinical Social Worker

## 2023-10-04 ENCOUNTER — Ambulatory Visit (INDEPENDENT_AMBULATORY_CARE_PROVIDER_SITE_OTHER): Admitting: Licensed Clinical Social Worker

## 2023-10-04 DIAGNOSIS — F325 Major depressive disorder, single episode, in full remission: Secondary | ICD-10-CM

## 2023-10-07 ENCOUNTER — Encounter (HOSPITAL_COMMUNITY): Payer: Self-pay | Admitting: Licensed Clinical Social Worker

## 2023-10-07 NOTE — Progress Notes (Signed)
 Virtual Visit via Video Note  I connected with Jose Perez on 10/04/23 at  2:30 PM EDT by a video enabled telemedicine application and verified that I am speaking with the correct person using two identifiers.  Location: Patient: home Provider: home office   I discussed the limitations of evaluation and management by telemedicine and the availability of in person appointments. The patient expressed understanding and agreed to proceed.   I discussed the assessment and treatment plan with the patient. The patient was provided an opportunity to ask questions and all were answered. The patient agreed with the plan and demonstrated an understanding of the instructions.   The patient was advised to call back or seek an in-person evaluation if the symptoms worsen or if the condition fails to improve as anticipated.  I provided 55 minutes of non-face-to-face time during this encounter.   [name], LCSW   THERAPIST PROGRESS NOTE  Session Time: 2:30pm-3:25pm  Participation Level: Active  Behavioral Response: Well GroomedAlertEuthymic  Type of Therapy: Individual Therapy  Treatment Goals addressed:   LTG: Reduce frequency, intensity, and duration of depression symptoms so that daily functioning is improved   ProgressTowards Goals: Progressing  Interventions: CBT  Summary: Jose Perez is a 73 y.o. male who presents with MDD, recurrent, in remission.   Suicidal/Homicidal: Nowithout intent/plan  Therapist Response: [name] engaged well in individual virtual session with clinician. Clinician utilized CBT to process thoughts, feelings, and interactions. [name] shared updates about recent birth of grandson, challenges with daughter, and upcoming need for surgery for his wife. Clinician explored ways to prioritize those significant stressors in his thoughts and his attention. Clinician explored emotional experiences and how [name] processes them without fear of becoming severely  depressed again. Clinician reflected the sadness he feels when he thinks about past severe episodes. Clinician identified that those thoughts do not come back, but the memories of the thoughts have impact. Clinician explored the usefulness of [name]'s studies about philosophy and religion in his attitude and understanding of his current experience.   Plan: Return again in 8 weeks.  Diagnosis: Major depressive disorder in full remission, unspecified whether recurrent (HCC)  Collaboration of Care: Patient refused AEB none required  Patient/Guardian was advised Release of Information must be obtained prior to any record release in order to collaborate their care with an outside provider. Patient/Guardian was advised if they have not already done so to contact the registration department to sign all necessary forms in order for us  to release information regarding their care.   Consent: Patient/Guardian gives verbal consent for treatment and assignment of benefits for services provided during this visit. Patient/Guardian expressed understanding and agreed to proceed.   Harlene JONELLE [place], LCSW 10/07/2023

## 2023-11-14 ENCOUNTER — Ambulatory Visit (INDEPENDENT_AMBULATORY_CARE_PROVIDER_SITE_OTHER): Admitting: Licensed Clinical Social Worker

## 2023-11-14 DIAGNOSIS — F325 Major depressive disorder, single episode, in full remission: Secondary | ICD-10-CM

## 2023-11-17 ENCOUNTER — Encounter (HOSPITAL_COMMUNITY): Payer: Self-pay | Admitting: Licensed Clinical Social Worker

## 2023-11-17 NOTE — Progress Notes (Signed)
 Virtual Visit via Video Note  I connected with Debby DELENA Igo on 11/14/23 at  1:30 PM EDT by a video enabled telemedicine application and verified that I am speaking with the correct person using two identifiers.  Location: Patient: home Provider: home office   I discussed the limitations of evaluation and management by telemedicine and the availability of in person appointments. The patient expressed understanding and agreed to proceed.   I discussed the assessment and treatment plan with the patient. The patient was provided an opportunity to ask questions and all were answered. The patient agreed with the plan and demonstrated an understanding of the instructions.   The patient was advised to call back or seek an in-person evaluation if the symptoms worsen or if the condition fails to improve as anticipated.  I provided 55 minutes of non-face-to-face time during this encounter.   [name], LCSW   THERAPIST PROGRESS NOTE  Session Time: 1:30pm-2:25pm  Participation Level: Active  Behavioral Response: Well GroomedAlertEuthymic  Type of Therapy: Individual Therapy  Treatment Goals addressed:   LTG: Reduce frequency, intensity, and duration of depression symptoms so that daily functioning is improved   ProgressTowards Goals: Progressing  Interventions: CBT  Summary: TAL KEMPKER is a 73 y.o. male who presents with MDD, in full remission, unspecified.   Suicidal/Homicidal: Nowithout intent/plan  Therapist Response: [name] engaged well in individual virtual session with clinician. Clinician utilized CBT to process thoughts, feelings, and interactions. Clinician explored updates on health of family unit, including wife who had recent surgery, daughter in law who just had a baby, and daughter who is in recovery. Clinician identified coping skills associated with [name]'s assistance in caring for his wife and children. Clinician also noted [name]'s person self care, which is  primarily associated with his quest for knowledge, research about the interconnectedness of humanity and the universe, and travels to different areas in the country to learn from the masters. Clinician processed learnings and noted that these journeys have made a significant impact of his life, his overall mood, and his reflections back on his own mental health in the past. Clinician processed these aha moments and reflected the sadness when looking back on his past hx of mental health and extensive treatments.   Plan: Return again in 4 weeks.  Diagnosis: Major depressive disorder in full remission, unspecified whether recurrent (HCC)  Collaboration of Care: Patient refused AEB none required  Patient/Guardian was advised Release of Information must be obtained prior to any record release in order to collaborate their care with an outside provider. Patient/Guardian was advised if they have not already done so to contact the registration department to sign all necessary forms in order for us  to release information regarding their care.   Consent: Patient/Guardian gives verbal consent for treatment and assignment of benefits for services provided during this visit. Patient/Guardian expressed understanding and agreed to proceed.   Harlene JONELLE [place], LCSW 11/17/2023

## 2023-12-12 ENCOUNTER — Ambulatory Visit (HOSPITAL_COMMUNITY): Admitting: Licensed Clinical Social Worker

## 2023-12-27 ENCOUNTER — Ambulatory Visit (HOSPITAL_COMMUNITY): Admitting: Licensed Clinical Social Worker

## 2023-12-27 DIAGNOSIS — F325 Major depressive disorder, single episode, in full remission: Secondary | ICD-10-CM | POA: Diagnosis not present

## 2023-12-28 ENCOUNTER — Encounter (HOSPITAL_COMMUNITY): Payer: Self-pay | Admitting: Licensed Clinical Social Worker

## 2023-12-28 NOTE — Progress Notes (Signed)
 Virtual Visit via Video Note  I connected with Jose Perez on 12/27/23 at  1:30 PM EDT by a video enabled telemedicine application and verified that I am speaking with the correct person using two identifiers.  Location: Patient: home Provider: home office   I discussed the limitations of evaluation and management by telemedicine and the availability of in person appointments. The patient expressed understanding and agreed to proceed.   I discussed the assessment and treatment plan with the patient. The patient was provided an opportunity to ask questions and all were answered. The patient agreed with the plan and demonstrated an understanding of the instructions.   The patient was advised to call back or seek an in-person evaluation if the symptoms worsen or if the condition fails to improve as anticipated.  I provided 55 minutes of non-face-to-face time during this encounter.   Jose JONELLE Rosser, LCSW   THERAPIST PROGRESS NOTE  Session Time: 1:30pm-2:25pm  Participation Level: Active  Behavioral Response: Well GroomedAlertEuthymic  Type of Therapy: Individual Therapy  Treatment Goals addressed: LTG: Reduce frequency, intensity, and duration of depression symptoms so that daily functioning is improved   ProgressTowards Goals: Progressing  Interventions: Motivational Interviewing  Summary: Jose Perez is a 73 y.o. male who presents with MDD, in full remission.   Suicidal/Homicidal: Nowithout intent/plan  Therapist Response: Jose Perez engaged well in individual virtual session with clinician. Clinician utilized MI OARS to reflect and summarize thoughts, feelings, and interactions. Clinician provided time and space for Jose Perez to share updates in his travels, learnings, and experiences since last session. Clinician discussed the value of the work he is doing in his learning and exploration into the philosophy of life and the main ideas that are shared between the different belief  systems. Clinician processed how these learnings have been monumental in Jose Perez's life view. Clinician explored thoughts about writing a book. Clinician also discussed how his experience may be helpful to others who also have so many questions about the meaning of life, thoughts about what happens when you die, and how to live a more fulfilling life. Jose Perez shared he is also using this learning to treat his depression, which has been in remission, but remains a traumatic aspect of his life.   Plan: Return again in 4 weeks.  Diagnosis: Major depressive disorder in full remission, unspecified whether recurrent (HCC)  Collaboration of Care: Patient refused AEB none required  Patient/Guardian was advised Release of Information must be obtained prior to any record release in order to collaborate their care with an outside provider. Patient/Guardian was advised if they have not already done so to contact the registration department to sign all necessary forms in order for us  to release information regarding their care.   Consent: Patient/Guardian gives verbal consent for treatment and assignment of benefits for services provided during this visit. Patient/Guardian expressed understanding and agreed to proceed.   Jose JONELLE [place], LCSW 12/28/2023

## 2024-01-24 ENCOUNTER — Ambulatory Visit (INDEPENDENT_AMBULATORY_CARE_PROVIDER_SITE_OTHER): Admitting: Licensed Clinical Social Worker

## 2024-01-24 DIAGNOSIS — F325 Major depressive disorder, single episode, in full remission: Secondary | ICD-10-CM

## 2024-02-08 ENCOUNTER — Encounter (HOSPITAL_COMMUNITY): Payer: Self-pay | Admitting: Licensed Clinical Social Worker

## 2024-02-08 NOTE — Progress Notes (Signed)
 Virtual Visit via Video Note  I connected with Jose Perez on 01/24/24 at  1:30 PM EDT by a video enabled telemedicine application and verified that I am speaking with the correct person using two identifiers.  Location: Patient: home Provider: home office   I discussed the limitations of evaluation and management by telemedicine and the availability of in person appointments. The patient expressed understanding and agreed to proceed.   I discussed the assessment and treatment plan with the patient. The patient was provided an opportunity to ask questions and all were answered. The patient agreed with the plan and demonstrated an understanding of the instructions.   The patient was advised to call back or seek an in-person evaluation if the symptoms worsen or if the condition fails to improve as anticipated.  I provided 55 minutes of non-face-to-face time during this encounter.   Jose JONELLE Rosser, LCSW   THERAPIST PROGRESS NOTE  Session Time: 1:30pm-2:25pm  Participation Level: Active  Behavioral Response: Well GroomedAlertEuthymic  Type of Therapy: Individual Therapy  Treatment Goals addressed: LTG: Reduce frequency, intensity, and duration of depression symptoms so that daily functioning is improved   ProgressTowards Goals: Progressing  Interventions: Solution Focused  Summary: Jose Perez is a 73 y.o. male who presents with MDD in full remission.   Suicidal/Homicidal: Nowith intent/plan  Therapist Response: Jose Perez engaged well in individual virtual session with facilities manager. Clinician utilized solution focused therapy to process recent challenging situations and coping skills/solutions. Clinician processed daughter's recent relapse into alcohol. Clinician explored thoughts and feelings, as well as Jose Perez's response. Jose Perez shared that he feels no remorse or regret from putting his daughter into a residential detox program. Clinician noted the importance of maintaining  sobriety in order for her to function as an adult.  Jose Perez discussed his recent study of the I Jose Perez and processed what he is learning. Jose Perez shared research into his spirituality and depression.   Plan: Return again in 6 weeks.  Diagnosis: Major depressive disorder in full remission, unspecified whether recurrent  Collaboration of Care: Patient refused AEB none required  Patient/Guardian was advised Release of Information must be obtained prior to any record release in order to collaborate their care with an outside provider. Patient/Guardian was advised if they have not already done so to contact the registration department to sign all necessary forms in order for us  to release information regarding their care.   Consent: Patient/Guardian gives verbal consent for treatment and assignment of benefits for services provided during this visit. Patient/Guardian expressed understanding and agreed to proceed.   Jose JONELLE [place], LCSW 02/08/2024

## 2024-02-13 ENCOUNTER — Ambulatory Visit (INDEPENDENT_AMBULATORY_CARE_PROVIDER_SITE_OTHER): Admitting: Licensed Clinical Social Worker

## 2024-02-13 DIAGNOSIS — F325 Major depressive disorder, single episode, in full remission: Secondary | ICD-10-CM | POA: Diagnosis not present

## 2024-02-14 ENCOUNTER — Encounter (HOSPITAL_COMMUNITY): Payer: Self-pay | Admitting: Licensed Clinical Social Worker

## 2024-02-14 NOTE — Progress Notes (Signed)
 Virtual Visit via Video Note  I connected with Debby DELENA Igo on 02/13/24 at  1:30 PM EST by a video enabled telemedicine application and verified that I am speaking with the correct person using two identifiers.  Location: Patient: home Provider: home office   I discussed the limitations of evaluation and management by telemedicine and the availability of in person appointments. The patient expressed understanding and agreed to proceed.   I discussed the assessment and treatment plan with the patient. The patient was provided an opportunity to ask questions and all were answered. The patient agreed with the plan and demonstrated an understanding of the instructions.   The patient was advised to call back or seek an in-person evaluation if the symptoms worsen or if the condition fails to improve as anticipated.  I provided 45 minutes of non-face-to-face time during this encounter.   [name], LCSW   THERAPIST PROGRESS NOTE  Session Time: 1:30pm-2:15pm  Participation Level: Active  Behavioral Response: NeatAlertEuthymic  Type of Therapy: Individual Therapy  Treatment Goals addressed: LTG: Reduce frequency, intensity, and duration of depression symptoms so that daily functioning is improved   ProgressTowards Goals: Progressing  Interventions: CBT  Summary: BRENSON HARTMAN is a 73 y.o. male who presents with MDD in full remission.   Suicidal/Homicidal: Nowithout intent/plan  Therapist Response: [name] engaged well in individual virtual session with clinician. Clinician utilized CBT to process thoughts, feelings, and interactions. [name] shared updates with family and his travels. Clinician processed daughter's rehab placement and her plan to enter an [facility] after her discharge. Clinician explored comfort and support offered by [name] toward his daughter and reflected the challenges of watching a loved one go through addiction. [name] shared he feels very confident in his  decision to send her to rehab. [name] shared updates with his spiritual journey and connections he is finding between the different world religions, spirituality, and philosophies. Clinician identified the amazing work he is doing and how that positively impacts his relationship with himself and with his family. Clinician shared psychoeducation about [employer].  Plan: Return again in 4 weeks.  Diagnosis: Major depressive disorder in full remission, unspecified whether recurrent  Collaboration of Care: Patient refused AEB none required  Patient/Guardian was advised Release of Information must be obtained prior to any record release in order to collaborate their care with an outside provider. Patient/Guardian was advised if they have not already done so to contact the registration department to sign all necessary forms in order for us  to release information regarding their care.   Consent: Patient/Guardian gives verbal consent for treatment and assignment of benefits for services provided during this visit. Patient/Guardian expressed understanding and agreed to proceed.   Harlene JONELLE [place], LCSW 02/14/2024

## 2024-03-19 ENCOUNTER — Ambulatory Visit (HOSPITAL_COMMUNITY): Admitting: Licensed Clinical Social Worker

## 2024-03-26 ENCOUNTER — Ambulatory Visit (HOSPITAL_COMMUNITY): Admitting: Licensed Clinical Social Worker

## 2024-03-26 DIAGNOSIS — F325 Major depressive disorder, single episode, in full remission: Secondary | ICD-10-CM

## 2024-04-04 ENCOUNTER — Encounter (HOSPITAL_COMMUNITY): Payer: Self-pay | Admitting: Licensed Clinical Social Worker

## 2024-04-04 NOTE — Progress Notes (Signed)
 Virtual Visit via Video Note  I connected with Jose Perez on 03/26/24 at  1:30 PM EST by a video enabled telemedicine application and verified that I am speaking with the correct person using two identifiers.  Location: Patient: home Provider: home office   I discussed the limitations of evaluation and management by telemedicine and the availability of in person appointments. The patient expressed understanding and agreed to proceed.   I discussed the assessment and treatment plan with the patient. The patient was provided an opportunity to ask questions and all were answered. The patient agreed with the plan and demonstrated an understanding of the instructions.   The patient was advised to call back or seek an in-person evaluation if the symptoms worsen or if the condition fails to improve as anticipated.  I provided 55 minutes of non-face-to-face time during this encounter.   Jose JONELLE Rosser, LCSW   THERAPIST PROGRESS NOTE  Session Time: 1:30pm-2:25pm  Participation Level: Active  Behavioral Response: Well GroomedAlertEuthymic  Type of Therapy: Individual Therapy  Treatment Goals addressed: LTG: Reduce frequency, intensity, and duration of depression symptoms so that daily functioning is improved   ProgressTowards Goals: Progressing  Interventions: Motivational Interviewing  Summary: Jose Perez is a 73 y.o. male who presents with MDD, in full remission.   Suicidal/Homicidal: Nowithout intent/plan  Therapist Response: Jose Perez engaged well in individual virtual session with clinician. Clinician utilized MI OARS to process thoughts, feelings, and interactions. Clinician explored updates in family and noted a recent return from about 2 weeks in [place]. Clinician discussed interactions while on the family vacation, as well as the spiritual experience of going to the [place], seeing the pope, and getting in touch with his catholic roots. Clinician processed the impact of  Jose Perez's studies, beliefs, and philosophical learning over the past years. Clinician explored depression levels and noted that it is a memory that can be easily accessed, but it does not seem to come to the surface as often. Clinician explored the feelings about depression and noted that it seems more sad than depressed.   Plan: Return again in 4 weeks.  Diagnosis: Major depressive disorder in full remission, unspecified whether recurrent  Collaboration of Care: Patient refused AEB none required  Patient/Guardian was advised Release of Information must be obtained prior to any record release in order to collaborate their care with an outside provider. Patient/Guardian was advised if they have not already done so to contact the registration department to sign all necessary forms in order for us  to release information regarding their care.   Consent: Patient/Guardian gives verbal consent for treatment and assignment of benefits for services provided during this visit. Patient/Guardian expressed understanding and agreed to proceed.   Jose JONELLE [place], LCSW 04/04/2024

## 2024-04-30 ENCOUNTER — Encounter (HOSPITAL_COMMUNITY): Payer: Self-pay

## 2024-04-30 ENCOUNTER — Ambulatory Visit (INDEPENDENT_AMBULATORY_CARE_PROVIDER_SITE_OTHER): Admitting: Licensed Clinical Social Worker

## 2024-04-30 DIAGNOSIS — F325 Major depressive disorder, single episode, in full remission: Secondary | ICD-10-CM

## 2024-05-05 ENCOUNTER — Encounter (HOSPITAL_COMMUNITY): Payer: Self-pay | Admitting: Licensed Clinical Social Worker

## 2024-05-05 NOTE — Progress Notes (Signed)
 Virtual Visit via Video Note  I connected with Jose Perez on 04/30/24 at  3:30 PM EST by a video enabled telemedicine application and verified that I am speaking with the correct person using two identifiers.  Location: Patient: home Provider: home office   I discussed the limitations of evaluation and management by telemedicine and the availability of in person appointments. The patient expressed understanding and agreed to proceed.    I discussed the assessment and treatment plan with the patient. The patient was provided an opportunity to ask questions and all were answered. The patient agreed with the plan and demonstrated an understanding of the instructions.   The patient was advised to call back or seek an in-person evaluation if the symptoms worsen or if the condition fails to improve as anticipated.  I provided 45 minutes of non-face-to-face time during this encounter.   Jose JONELLE Rosser, LCSW   THERAPIST PROGRESS NOTE  Session Time: 3:30pm-4:15pm  Participation Level: Active  Behavioral Response: Well GroomedAlertEuthymic  Type of Therapy: Individual Therapy  Treatment Goals addressed:  Active     OP Depression     LTG: Reduce frequency, intensity, and duration of depression symptoms so that daily functioning is improved (Progressing)     Start:  07/11/22    Expected End:  04/29/25         STG: Zariah will identify cognitive patterns and beliefs that support depression (Progressing)     Start:  07/11/22    Expected End:  04/29/25            ProgressTowards Goals: Progressing  Interventions: CBT  Summary: Jose Perez is a 74 y.o. male who presents with MDD in remission.   Suicidal/Homicidal: Nowithout intent/plan  Therapist Response: [name] engaged well in individual virtual session with clinician. Clinician utilized CBT to process thoughts, feelings, and interactions. Clinician explored updates in socialization, mood, travels, etc. [name] shared  overall continuing well being due to being active, healthy, and continuing his research on world religions, philosophy, and treating his own depression. Clinician discussed how his education has lent to his understanding of human nature and how he incorporates this into his daily life.  Clinician provided feedback about CBT and how this reflects this type of understanding of human behavior and instincts.  Plan: Return again in 4 weeks.  Diagnosis: Major depressive disorder in remission, unspecified whether recurrent  Collaboration of Care: Patient refused AEB none required  Patient/Guardian was advised Release of Information must be obtained prior to any record release in order to collaborate their care with an outside provider. Patient/Guardian was advised if they have not already done so to contact the registration department to sign all necessary forms in order for us  to release information regarding their care.   Consent: Patient/Guardian gives verbal consent for treatment and assignment of benefits for services provided during this visit. Patient/Guardian expressed understanding and agreed to proceed.   Jose JONELLE [place], LCSW 05/05/2024

## 2024-06-04 ENCOUNTER — Ambulatory Visit (HOSPITAL_COMMUNITY): Admitting: Licensed Clinical Social Worker
